# Patient Record
Sex: Female | Born: 1960 | Race: Black or African American | Hispanic: No | Marital: Single | Smoking: Current every day smoker
Health system: Southern US, Community
[De-identification: ages and names within clinical notes are randomized; demographics above are authoritative.]

## PROBLEM LIST (undated history)

## (undated) DIAGNOSIS — T7840XA Allergy, unspecified, initial encounter: Secondary | ICD-10-CM

## (undated) DIAGNOSIS — G4733 Obstructive sleep apnea (adult) (pediatric): Secondary | ICD-10-CM

## (undated) DIAGNOSIS — E079 Disorder of thyroid, unspecified: Secondary | ICD-10-CM

## (undated) DIAGNOSIS — H9190 Unspecified hearing loss, unspecified ear: Secondary | ICD-10-CM

## (undated) DIAGNOSIS — Z9989 Dependence on other enabling machines and devices: Secondary | ICD-10-CM

## (undated) HISTORY — DX: Allergy, unspecified, initial encounter: T78.40XA

## (undated) HISTORY — DX: Dependence on other enabling machines and devices: Z99.89

## (undated) HISTORY — DX: Disorder of thyroid, unspecified: E07.9

## (undated) HISTORY — DX: Obstructive sleep apnea (adult) (pediatric): G47.33

---

## 2003-05-09 ENCOUNTER — Encounter: Payer: Self-pay | Admitting: Emergency Medicine

## 2003-05-09 ENCOUNTER — Emergency Department (HOSPITAL_COMMUNITY): Admission: EM | Admit: 2003-05-09 | Discharge: 2003-05-10 | Payer: Self-pay | Admitting: Emergency Medicine

## 2004-02-16 ENCOUNTER — Emergency Department (HOSPITAL_COMMUNITY): Admission: EM | Admit: 2004-02-16 | Discharge: 2004-02-16 | Payer: Self-pay | Admitting: Emergency Medicine

## 2004-02-16 IMAGING — CR DG THORACIC SPINE 2V
3 series · 3 of 3 positions shown · non-contrast
Comparison: none

CLINICAL DATA: Metal container fell on shoulder and upper back.
 CERVICAL SPIINE FIVE VIEWS
 There is bony spurring at C5-6 and C6-7.  No fracture.
 IMPRESSION
 Degenerative changes of the lower cervical spine without fracture.  
 THORACIC SPINE
 AP and lateral views reveal diffuse bony spurring without fracture.
 No fracture.
 RIGHT SHOULDER TWO VIEWS
 No abnormality.

[view not recorded (1 of 3)]
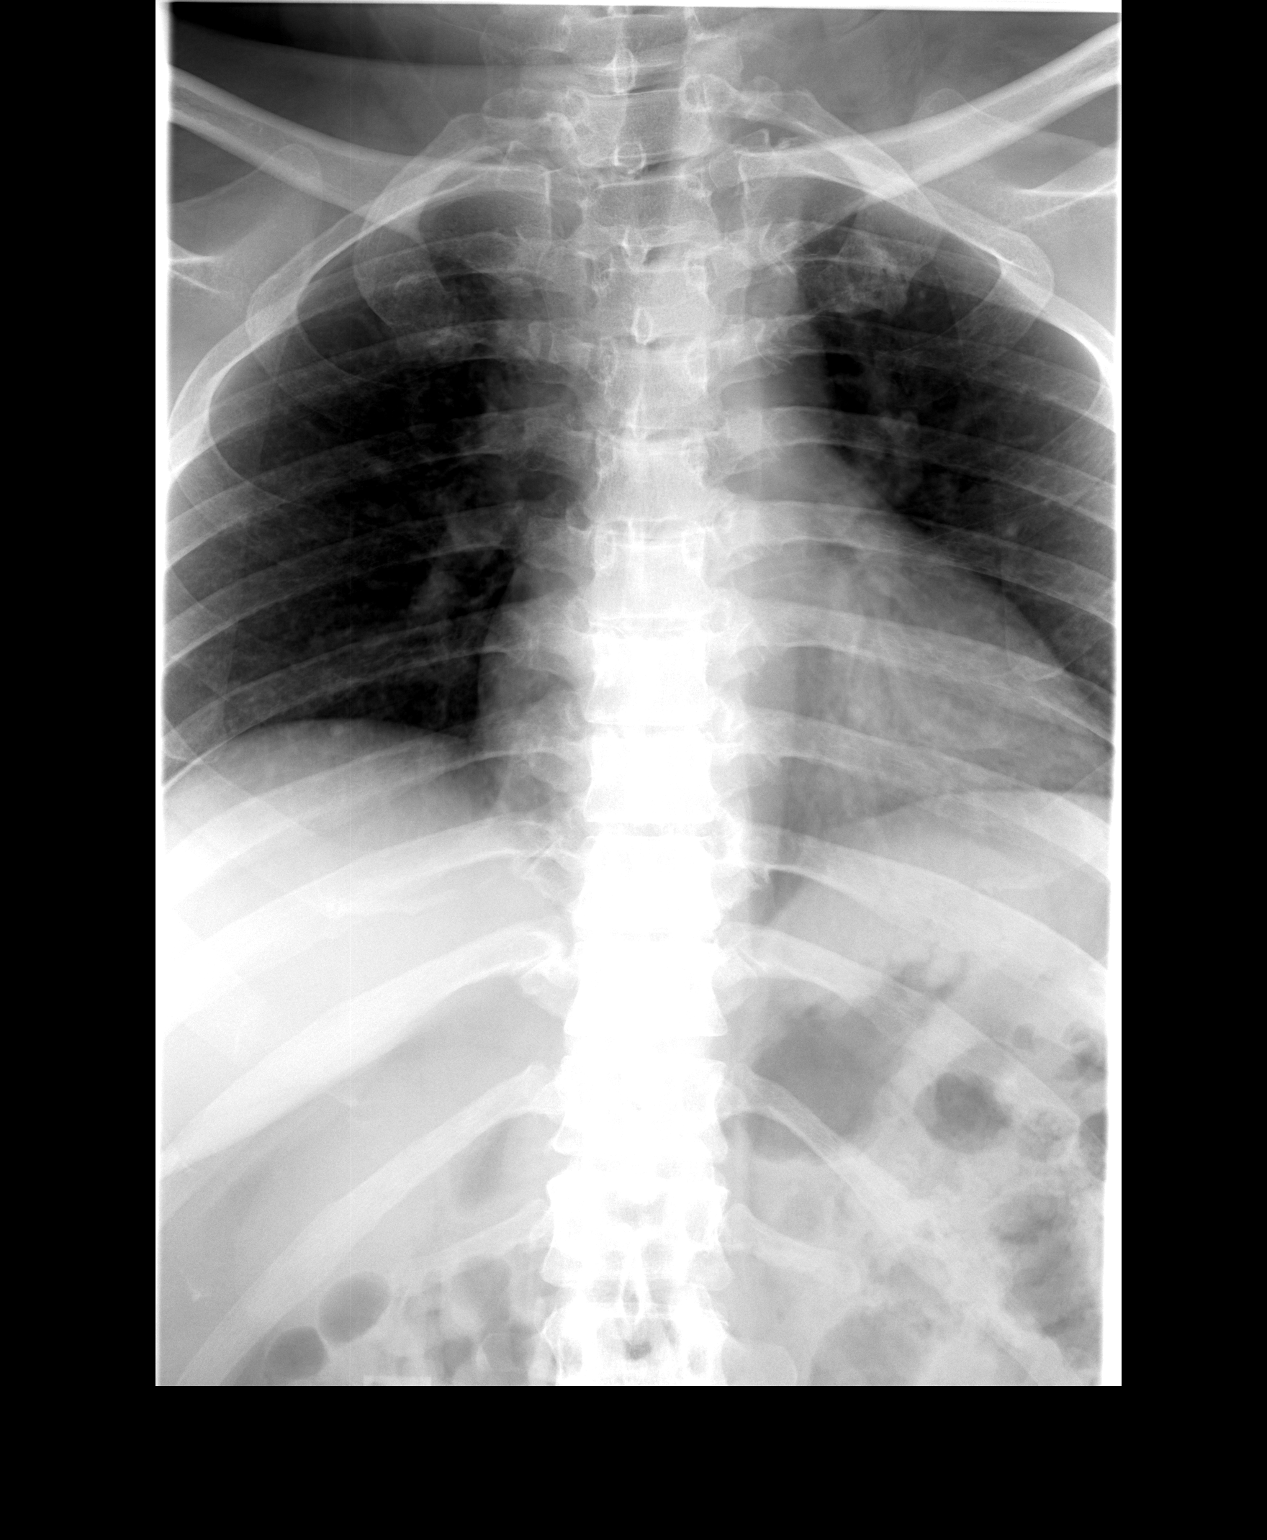

[view not recorded (2 of 3)]
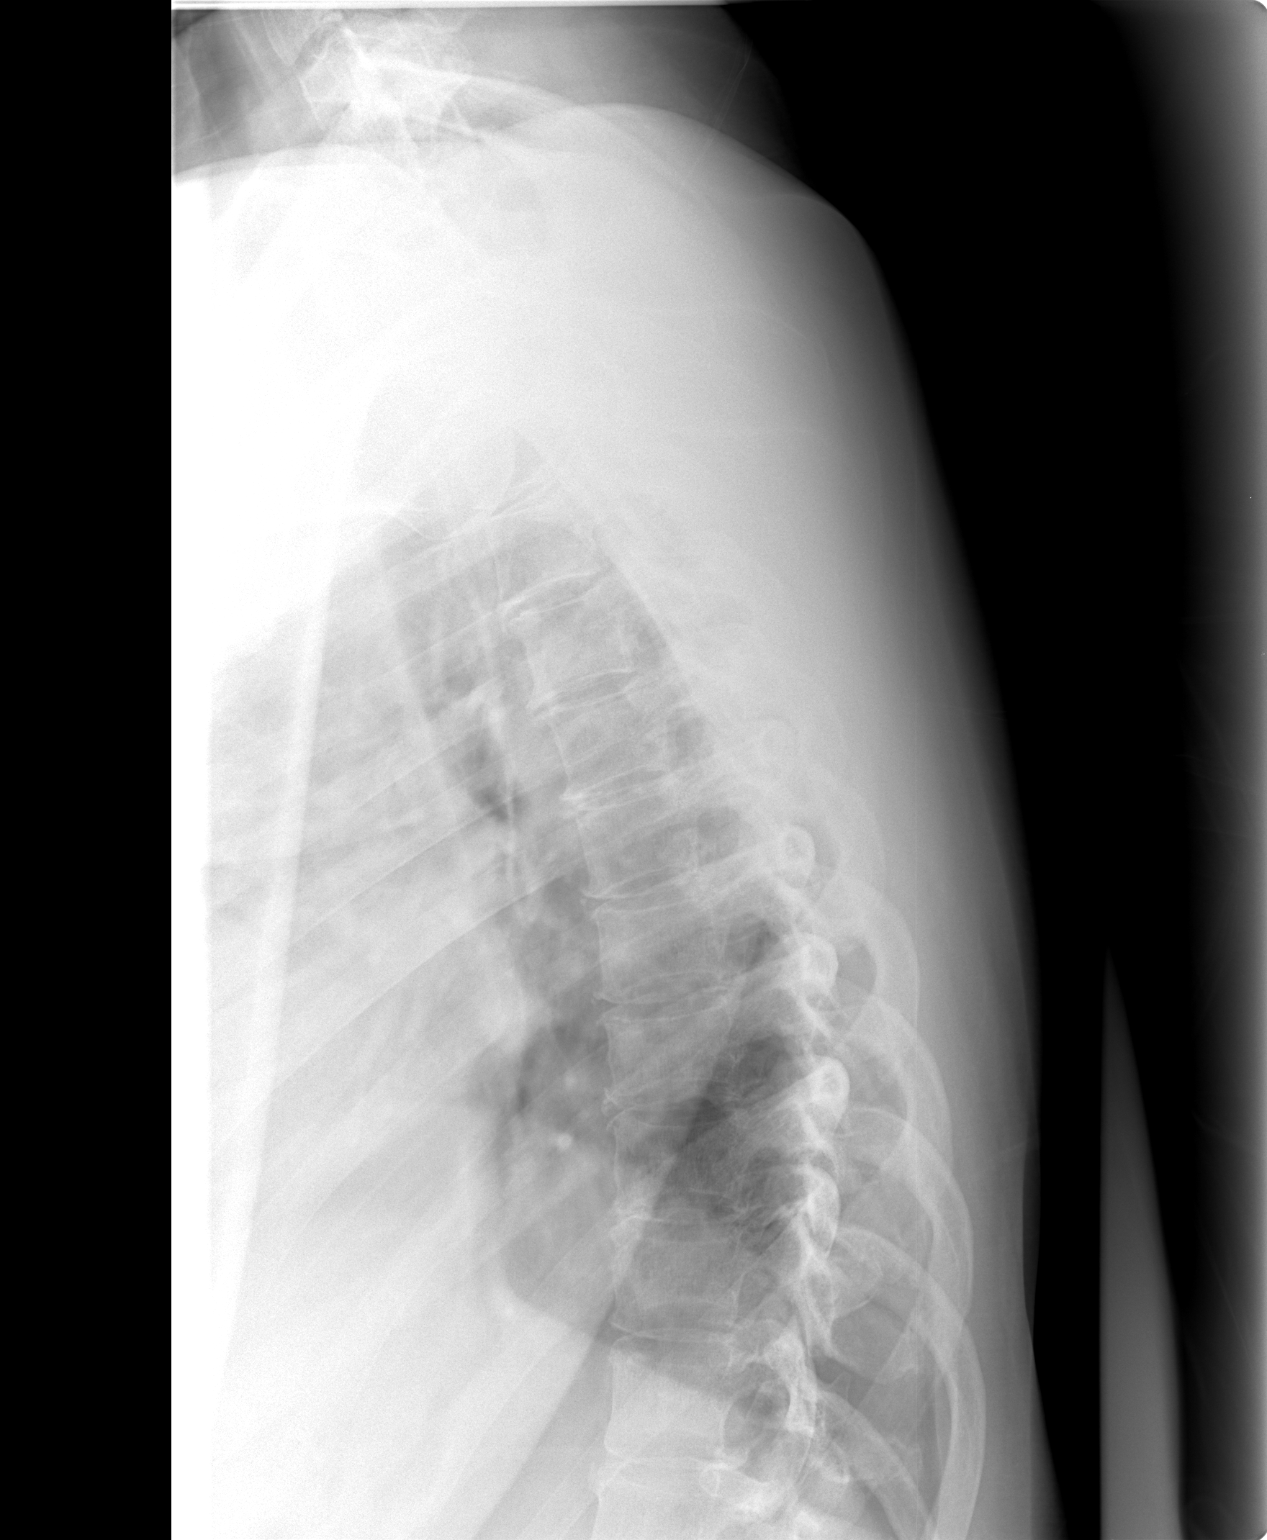

[view not recorded (3 of 3)]
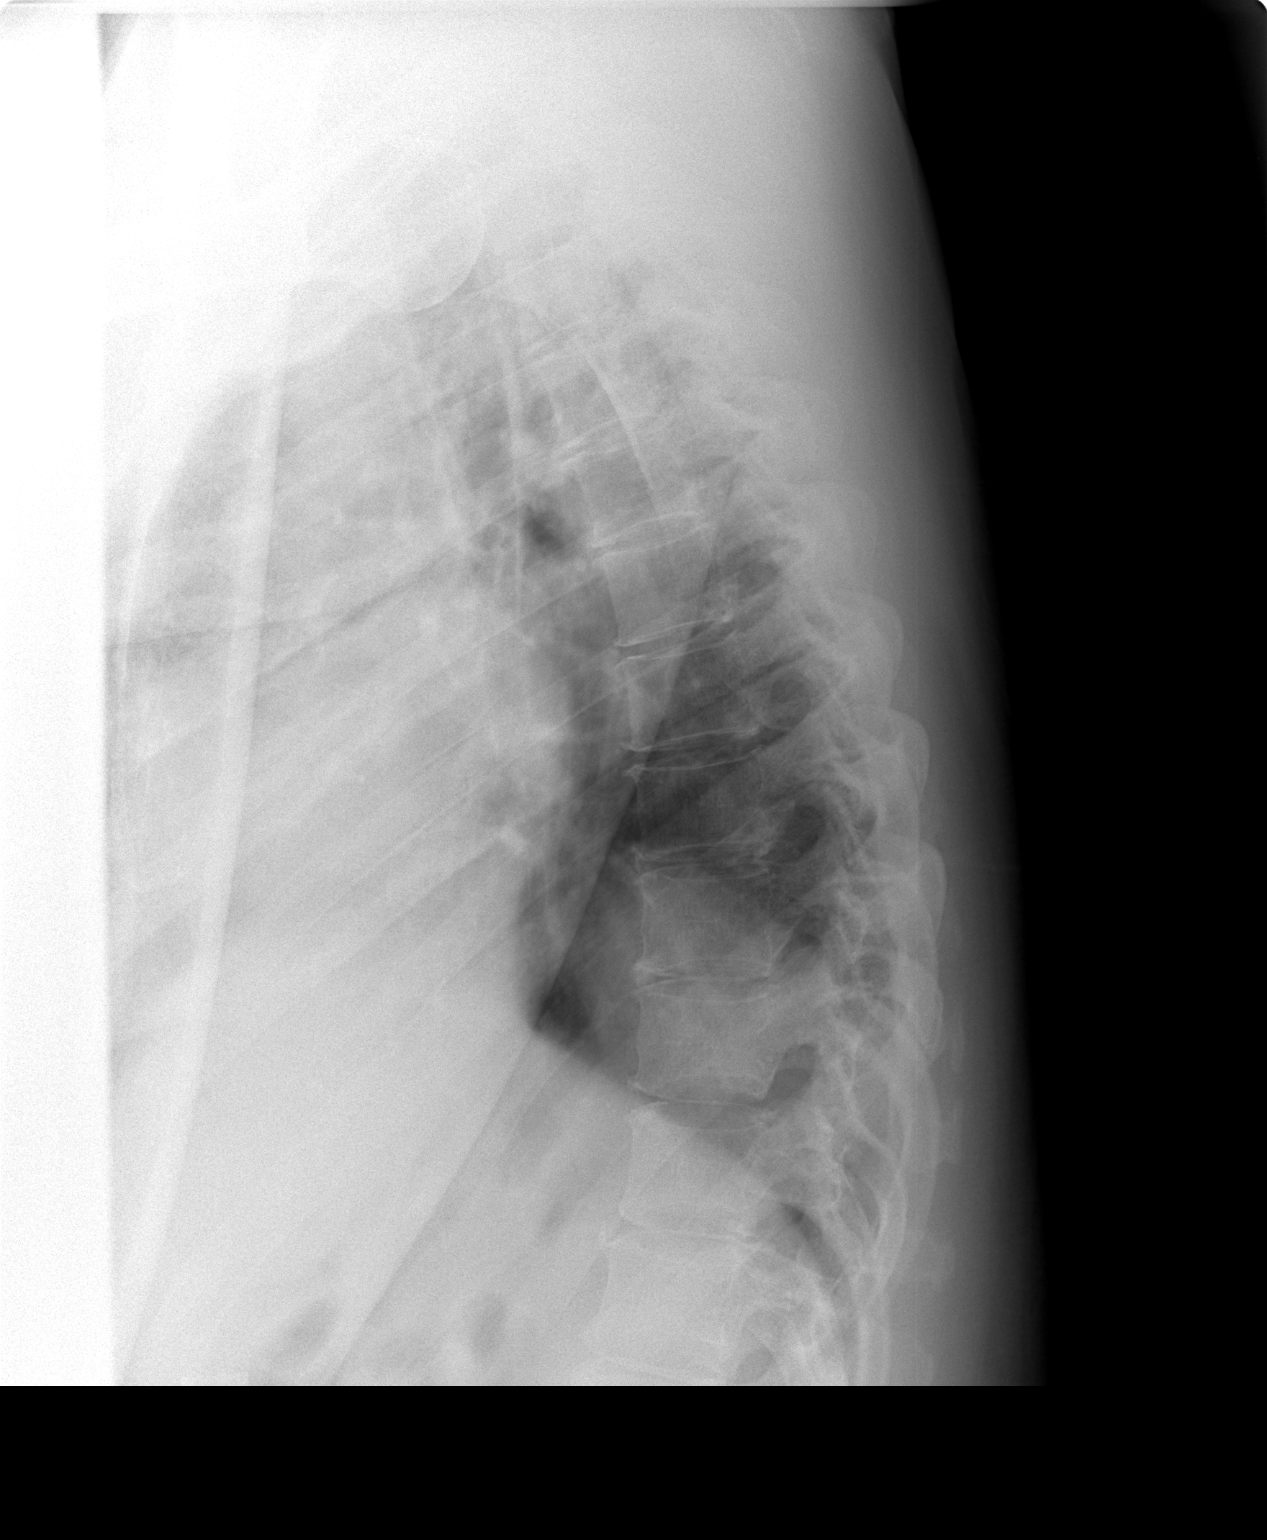

[3 of 3 positions shown; findings below may reference images not displayed]

## 2004-06-18 ENCOUNTER — Emergency Department (HOSPITAL_COMMUNITY): Admission: EM | Admit: 2004-06-18 | Discharge: 2004-06-18 | Payer: Self-pay | Admitting: Emergency Medicine

## 2010-04-20 ENCOUNTER — Emergency Department (HOSPITAL_COMMUNITY): Admission: EM | Admit: 2010-04-20 | Discharge: 2010-04-20 | Payer: Self-pay | Admitting: Emergency Medicine

## 2010-04-20 ENCOUNTER — Ambulatory Visit: Payer: Self-pay | Admitting: Vascular Surgery

## 2010-04-20 ENCOUNTER — Encounter (INDEPENDENT_AMBULATORY_CARE_PROVIDER_SITE_OTHER): Payer: Self-pay | Admitting: Emergency Medicine

## 2010-12-17 LAB — COMPREHENSIVE METABOLIC PANEL
AST: 16 U/L (ref 0–37)
CO2: 28 mEq/L (ref 19–32)
Calcium: 8.8 mg/dL (ref 8.4–10.5)
Chloride: 106 mEq/L (ref 96–112)
GFR calc Af Amer: >60 mL/min (ref >60)
GFR calc non Af Amer: >60 mL/min (ref >60)
Potassium: 4 mEq/L (ref 3.5–5.1)
Sodium: 138 mEq/L (ref 135–145)
Total Bilirubin: 0.3 mg/dL (ref 0.3–1.2)
Total Protein: 6 g/dL (ref 6.0–8.3)

## 2010-12-17 LAB — DIFFERENTIAL
Basophils Absolute: 0.1 10*3/uL (ref 0.0–0.1)
Eosinophils Relative: 1 % (ref 0–5)
Lymphocytes Relative: 53 % — ABNORMAL HIGH (ref 12–46)
Lymphs Abs: 4.3 10*3/uL — ABNORMAL HIGH (ref 0.7–4.0)
Monocytes Relative: 7 % (ref 3–12)
Neutro Abs: 3.2 10*3/uL (ref 1.7–7.7)
Neutrophils Relative %: 39 % — ABNORMAL LOW (ref 43–77)

## 2010-12-17 LAB — PROTIME-INR: Prothrombin Time: 12.6 seconds (ref 11.6–15.2)

## 2010-12-17 LAB — URINALYSIS, ROUTINE W REFLEX MICROSCOPIC
Bilirubin Urine: NEGATIVE
Leukocytes, UA: NEGATIVE
Nitrite: NEGATIVE
Urobilinogen, UA: 0.2 mg/dL (ref 0.0–1.0)

## 2010-12-17 LAB — POCT CARDIAC MARKERS
CKMB, poc: <1 ng/mL — ABNORMAL LOW (ref 1.0–8.0)
CKMB, poc: <1 ng/mL — ABNORMAL LOW (ref 1.0–8.0)
Myoglobin, poc: 67 ng/mL (ref 12–200)
Troponin i, poc: <0.05 ng/mL (ref 0.00–0.09)
Troponin i, poc: <0.05 ng/mL (ref 0.00–0.09)

## 2010-12-17 LAB — CBC
HCT: 38.7 % (ref 36.0–46.0)
Hemoglobin: 13.4 g/dL (ref 12.0–15.0)
MCH: 29.4 pg (ref 26.0–34.0)
MCHC: 34.6 g/dL (ref 30.0–36.0)
RDW: 12.9 % (ref 11.5–15.5)

## 2010-12-17 LAB — URINE MICROSCOPIC-ADD ON

## 2010-12-17 LAB — D-DIMER, QUANTITATIVE: D-Dimer, Quant: 0.33 ug/mL-FEU (ref 0.00–0.48)

## 2011-10-03 DIAGNOSIS — G4733 Obstructive sleep apnea (adult) (pediatric): Secondary | ICD-10-CM

## 2011-10-03 HISTORY — DX: Obstructive sleep apnea (adult) (pediatric): G47.33

## 2012-09-12 ENCOUNTER — Other Ambulatory Visit (INDEPENDENT_AMBULATORY_CARE_PROVIDER_SITE_OTHER): Payer: PRIVATE HEALTH INSURANCE

## 2012-09-12 ENCOUNTER — Ambulatory Visit (INDEPENDENT_AMBULATORY_CARE_PROVIDER_SITE_OTHER): Payer: PRIVATE HEALTH INSURANCE | Admitting: Internal Medicine

## 2012-09-12 ENCOUNTER — Encounter: Payer: Self-pay | Admitting: Internal Medicine

## 2012-09-12 ENCOUNTER — Other Ambulatory Visit (HOSPITAL_COMMUNITY)
Admission: RE | Admit: 2012-09-12 | Discharge: 2012-09-12 | Disposition: A | Discharge status: Home / Self Care | Payer: No Typology Code available for payment source | Source: Ambulatory Visit | Attending: Internal Medicine | Admitting: Internal Medicine

## 2012-09-12 ENCOUNTER — Other Ambulatory Visit: Payer: Self-pay | Admitting: Internal Medicine

## 2012-09-12 VITALS — BP 114/78 | HR 68 | Temp 97.7°F | Resp 16 | Wt 258.0 lb

## 2012-09-12 DIAGNOSIS — Z0000 Encounter for general adult medical examination without abnormal findings: Secondary | ICD-10-CM | POA: Insufficient documentation

## 2012-09-12 DIAGNOSIS — Z23 Encounter for immunization: Secondary | ICD-10-CM | POA: Insufficient documentation

## 2012-09-12 DIAGNOSIS — Z1231 Encounter for screening mammogram for malignant neoplasm of breast: Secondary | ICD-10-CM

## 2012-09-12 DIAGNOSIS — E049 Nontoxic goiter, unspecified: Secondary | ICD-10-CM

## 2012-09-12 DIAGNOSIS — E010 Iodine-deficiency related diffuse (endemic) goiter: Secondary | ICD-10-CM | POA: Insufficient documentation

## 2012-09-12 DIAGNOSIS — Z01419 Encounter for gynecological examination (general) (routine) without abnormal findings: Secondary | ICD-10-CM | POA: Insufficient documentation

## 2012-09-12 DIAGNOSIS — Z124 Encounter for screening for malignant neoplasm of cervix: Secondary | ICD-10-CM | POA: Insufficient documentation

## 2012-09-12 LAB — URINALYSIS, ROUTINE W REFLEX MICROSCOPIC
Specific Gravity, Urine: 1.01 (ref 1.000–1.030)
Total Protein, Urine: NEGATIVE
Urine Glucose: NEGATIVE

## 2012-09-12 LAB — COMPREHENSIVE METABOLIC PANEL
ALT: 12 U/L (ref 0–35)
Albumin: 3.9 g/dL (ref 3.5–5.2)
CO2: 26 mEq/L (ref 19–32)
Calcium: 9 mg/dL (ref 8.4–10.5)
Creatinine, Ser: 0.9 mg/dL (ref 0.4–1.2)
Glucose, Bld: 102 mg/dL — ABNORMAL HIGH (ref 70–99)
Potassium: 4.2 mEq/L (ref 3.5–5.1)
Sodium: 135 mEq/L (ref 135–145)
Total Bilirubin: 0.8 mg/dL (ref 0.3–1.2)

## 2012-09-12 LAB — CBC WITH DIFFERENTIAL/PLATELET
Eosinophils Relative: 0.5 % (ref 0.0–5.0)
HCT: 40.1 % (ref 36.0–46.0)
Hemoglobin: 13.8 g/dL (ref 12.0–15.0)
Lymphs Abs: 2.8 10*3/uL (ref 0.7–4.0)
MCHC: 34.5 g/dL (ref 30.0–36.0)
MCV: 85.4 fl (ref 78.0–100.0)
Neutrophils Relative %: 51.2 % (ref 43.0–77.0)
Platelets: 262 10*3/uL (ref 150.0–400.0)
RBC: 4.69 Mil/uL (ref 3.87–5.11)
RDW: 13.2 % (ref 11.5–14.6)
WBC: 7.1 10*3/uL (ref 4.5–10.5)

## 2012-09-12 LAB — LIPID PANEL
HDL: 42.4 mg/dL (ref >39.00)
Total CHOL/HDL Ratio: 5
VLDL: 22.6 mg/dL (ref 0.0–40.0)

## 2012-09-12 LAB — T3, FREE: T3, Free: 3.2 pg/mL (ref 2.3–4.2)

## 2012-09-12 LAB — HM PAP SMEAR: HM Pap smear: NORMAL

## 2012-09-12 LAB — TSH: TSH: 0.63 u[IU]/mL (ref 0.35–5.50)

## 2012-09-12 NOTE — Patient Instructions (Addendum)
Preventive Care for Adults, Female A healthy lifestyle and preventive care can promote health and wellness. Preventive health guidelines for women include the following key practices.  A routine yearly physical is a good way to check with your caregiver about your health and preventive screening. It is a chance to share any concerns and updates on your health, and to receive a thorough exam.  Visit your dentist for a routine exam and preventive care every 6 months. Brush your teeth twice a day and floss once a day. Good oral hygiene prevents tooth decay and gum disease.  The frequency of eye exams is based on your age, health, family medical history, use of contact lenses, and other factors. Follow your caregiver's recommendations for frequency of eye exams.  Eat a healthy diet. Foods like vegetables, fruits, whole grains, low-fat dairy products, and lean protein foods contain the nutrients you need without too many calories. Decrease your intake of foods high in solid fats, added sugars, and salt. Eat the right amount of calories for you.Get information about a proper diet from your caregiver, if necessary.  Regular physical exercise is one of the most important things you can do for your health. Most adults should get at least 150 minutes of moderate-intensity exercise (any activity that increases your heart rate and causes you to sweat) each week. In addition, most adults need muscle-strengthening exercises on 2 or more days a week.  Maintain a healthy weight. The body mass index (BMI) is a screening tool to identify possible weight problems. It provides an estimate of body fat based on height and weight. Your caregiver can help determine your BMI, and can help you achieve or maintain a healthy weight.For adults 20 years and older:  A BMI below 18.5 is considered underweight.  A BMI of 18.5 to 24.9 is normal.  A BMI of 25 to 29.9 is considered overweight.  A BMI of 30 and above is  considered obese.  Maintain normal blood lipids and cholesterol levels by exercising and minimizing your intake of saturated fat. Eat a balanced diet with plenty of fruit and vegetables. Blood tests for lipids and cholesterol should begin at age 20 and be repeated every 5 years. If your lipid or cholesterol levels are high, you are over 50, or you are at high risk for heart disease, you may need your cholesterol levels checked more frequently.Ongoing high lipid and cholesterol levels should be treated with medicines if diet and exercise are not effective.  If you smoke, find out from your caregiver how to quit. If you do not use tobacco, do not start.  If you are pregnant, do not drink alcohol. If you are breastfeeding, be very cautious about drinking alcohol. If you are not pregnant and choose to drink alcohol, do not exceed 1 drink per day. One drink is considered to be 12 ounces (355 mL) of beer, 5 ounces (148 mL) of wine, or 1.5 ounces (44 mL) of liquor.  Avoid use of street drugs. Do not share needles with anyone. Ask for help if you need support or instructions about stopping the use of drugs.  High blood pressure causes heart disease and increases the risk of stroke. Your blood pressure should be checked at least every 1 to 2 years. Ongoing high blood pressure should be treated with medicines if weight loss and exercise are not effective.  If you are 55 to 50 years old, ask your caregiver if you should take aspirin to prevent strokes.  Diabetes   screening involves taking a blood sample to check your fasting blood sugar level. This should be done once every 3 years, after age 45, if you are within normal weight and without risk factors for diabetes. Testing should be considered at a younger age or be carried out more frequently if you are overweight and have at least 1 risk factor for diabetes.  Breast cancer screening is essential preventive care for women. You should practice "breast  self-awareness." This means understanding the normal appearance and feel of your breasts and may include breast self-examination. Any changes detected, no matter how small, should be reported to a caregiver. Women in their 20s and 30s should have a clinical breast exam (CBE) by a caregiver as part of a regular health exam every 1 to 3 years. After age 40, women should have a CBE every year. Starting at age 40, women should consider having a mammography (breast X-ray test) every year. Women who have a family history of breast cancer should talk to their caregiver about genetic screening. Women at a high risk of breast cancer should talk to their caregivers about having magnetic resonance imaging (MRI) and a mammography every year.  The Pap test is a screening test for cervical cancer. A Pap test can show cell changes on the cervix that might become cervical cancer if left untreated. A Pap test is a procedure in which cells are obtained and examined from the lower end of the uterus (cervix).  Women should have a Pap test starting at age 21.  Between ages 21 and 29, Pap tests should be repeated every 2 years.  Beginning at age 30, you should have a Pap test every 3 years as long as the past 3 Pap tests have been normal.  Some women have medical problems that increase the chance of getting cervical cancer. Talk to your caregiver about these problems. It is especially important to talk to your caregiver if a new problem develops soon after your last Pap test. In these cases, your caregiver may recommend more frequent screening and Pap tests.  The above recommendations are the same for women who have or have not gotten the vaccine for human papillomavirus (HPV).  If you had a hysterectomy for a problem that was not cancer or a condition that could lead to cancer, then you no longer need Pap tests. Even if you no longer need a Pap test, a regular exam is a good idea to make sure no other problems are  starting.  If you are between ages 65 and 70, and you have had normal Pap tests going back 10 years, you no longer need Pap tests. Even if you no longer need a Pap test, a regular exam is a good idea to make sure no other problems are starting.  If you have had past treatment for cervical cancer or a condition that could lead to cancer, you need Pap tests and screening for cancer for at least 20 years after your treatment.  If Pap tests have been discontinued, risk factors (such as a new sexual partner) need to be reassessed to determine if screening should be resumed.  The HPV test is an additional test that may be used for cervical cancer screening. The HPV test looks for the virus that can cause the cell changes on the cervix. The cells collected during the Pap test can be tested for HPV. The HPV test could be used to screen women aged 30 years and older, and should   be used in women of any age who have unclear Pap test results. After the age of 30, women should have HPV testing at the same frequency as a Pap test.  Colorectal cancer can be detected and often prevented. Most routine colorectal cancer screening begins at the age of 50 and continues through age 75. However, your caregiver may recommend screening at an earlier age if you have risk factors for colon cancer. On a yearly basis, your caregiver may provide home test kits to check for hidden blood in the stool. Use of a small camera at the end of a tube, to directly examine the colon (sigmoidoscopy or colonoscopy), can detect the earliest forms of colorectal cancer. Talk to your caregiver about this at age 50, when routine screening begins. Direct examination of the colon should be repeated every 5 to 10 years through age 75, unless early forms of pre-cancerous polyps or small growths are found.  Hepatitis C blood testing is recommended for all people born from 1945 through 1965 and any individual with known risks for hepatitis C.  Practice  safe sex. Use condoms and avoid high-risk sexual practices to reduce the spread of sexually transmitted infections (STIs). STIs include gonorrhea, chlamydia, syphilis, trichomonas, herpes, HPV, and human immunodeficiency virus (HIV). Herpes, HIV, and HPV are viral illnesses that have no cure. They can result in disability, cancer, and death. Sexually active women aged 25 and younger should be checked for chlamydia. Older women with new or multiple partners should also be tested for chlamydia. Testing for other STIs is recommended if you are sexually active and at increased risk.  Osteoporosis is a disease in which the bones lose minerals and strength with aging. This can result in serious bone fractures. The risk of osteoporosis can be identified using a bone density scan. Women ages 65 and over and women at risk for fractures or osteoporosis should discuss screening with their caregivers. Ask your caregiver whether you should take a calcium supplement or vitamin D to reduce the rate of osteoporosis.  Menopause can be associated with physical symptoms and risks. Hormone replacement therapy is available to decrease symptoms and risks. You should talk to your caregiver about whether hormone replacement therapy is right for you.  Use sunscreen with sun protection factor (SPF) of 30 or more. Apply sunscreen liberally and repeatedly throughout the day. You should seek shade when your shadow is shorter than you. Protect yourself by wearing long sleeves, pants, a wide-brimmed hat, and sunglasses year round, whenever you are outdoors.  Once a month, do a whole body skin exam, using a mirror to look at the skin on your back. Notify your caregiver of new moles, moles that have irregular borders, moles that are larger than a pencil eraser, or moles that have changed in shape or color.  Stay current with required immunizations.  Influenza. You need a dose every fall (or winter). The composition of the flu vaccine  changes each year, so being vaccinated once is not enough.  Pneumococcal polysaccharide. You need 1 to 2 doses if you smoke cigarettes or if you have certain chronic medical conditions. You need 1 dose at age 65 (or older) if you have never been vaccinated.  Tetanus, diphtheria, pertussis (Tdap, Td). Get 1 dose of Tdap vaccine if you are younger than age 65, are over 65 and have contact with an infant, are a healthcare worker, are pregnant, or simply want to be protected from whooping cough. After that, you need a Td   booster dose every 10 years. Consult your caregiver if you have not had at least 3 tetanus and diphtheria-containing shots sometime in your life or have a deep or dirty wound.  HPV. You need this vaccine if you are a woman age 26 or younger. The vaccine is given in 3 doses over 6 months.  Measles, mumps, rubella (MMR). You need at least 1 dose of MMR if you were born in 1957 or later. You may also need a second dose.  Meningococcal. If you are age 19 to 21 and a first-year college student living in a residence hall, or have one of several medical conditions, you need to get vaccinated against meningococcal disease. You may also need additional booster doses.  Zoster (shingles). If you are age 60 or older, you should get this vaccine.  Varicella (chickenpox). If you have never had chickenpox or you were vaccinated but received only 1 dose, talk to your caregiver to find out if you need this vaccine.  Hepatitis A. You need this vaccine if you have a specific risk factor for hepatitis A virus infection or you simply wish to be protected from this disease. The vaccine is usually given as 2 doses, 6 to 18 months apart.  Hepatitis B. You need this vaccine if you have a specific risk factor for hepatitis B virus infection or you simply wish to be protected from this disease. The vaccine is given in 3 doses, usually over 6 months. Preventive Services / Frequency Ages 19 to 39  Blood  pressure check.** / Every 1 to 2 years.  Lipid and cholesterol check.** / Every 5 years beginning at age 20.  Clinical breast exam.** / Every 3 years for women in their 20s and 30s.  Pap test.** / Every 2 years from ages 21 through 29. Every 3 years starting at age 30 through age 65 or 70 with a history of 3 consecutive normal Pap tests.  HPV screening.** / Every 3 years from ages 30 through ages 65 to 70 with a history of 3 consecutive normal Pap tests.  Hepatitis C blood test.** / For any individual with known risks for hepatitis C.  Skin self-exam. / Monthly.  Influenza immunization.** / Every year.  Pneumococcal polysaccharide immunization.** / 1 to 2 doses if you smoke cigarettes or if you have certain chronic medical conditions.  Tetanus, diphtheria, pertussis (Tdap, Td) immunization. / A one-time dose of Tdap vaccine. After that, you need a Td booster dose every 10 years.  HPV immunization. / 3 doses over 6 months, if you are 26 and younger.  Measles, mumps, rubella (MMR) immunization. / You need at least 1 dose of MMR if you were born in 1957 or later. You may also need a second dose.  Meningococcal immunization. / 1 dose if you are age 19 to 21 and a first-year college student living in a residence hall, or have one of several medical conditions, you need to get vaccinated against meningococcal disease. You may also need additional booster doses.  Varicella immunization.** / Consult your caregiver.  Hepatitis A immunization.** / Consult your caregiver. 2 doses, 6 to 18 months apart.  Hepatitis B immunization.** / Consult your caregiver. 3 doses usually over 6 months. Ages 40 to 64  Blood pressure check.** / Every 1 to 2 years.  Lipid and cholesterol check.** / Every 5 years beginning at age 20.  Clinical breast exam.** / Every year after age 40.  Mammogram.** / Every year beginning at age 40   and continuing for as long as you are in good health. Consult with your  caregiver.  Pap test.** / Every 3 years starting at age 30 through age 65 or 70 with a history of 3 consecutive normal Pap tests.  HPV screening.** / Every 3 years from ages 30 through ages 65 to 70 with a history of 3 consecutive normal Pap tests.  Fecal occult blood test (FOBT) of stool. / Every year beginning at age 50 and continuing until age 75. You may not need to do this test if you get a colonoscopy every 10 years.  Flexible sigmoidoscopy or colonoscopy.** / Every 5 years for a flexible sigmoidoscopy or every 10 years for a colonoscopy beginning at age 50 and continuing until age 75.  Hepatitis C blood test.** / For all people born from 1945 through 1965 and any individual with known risks for hepatitis C.  Skin self-exam. / Monthly.  Influenza immunization.** / Every year.  Pneumococcal polysaccharide immunization.** / 1 to 2 doses if you smoke cigarettes or if you have certain chronic medical conditions.  Tetanus, diphtheria, pertussis (Tdap, Td) immunization.** / A one-time dose of Tdap vaccine. After that, you need a Td booster dose every 10 years.  Measles, mumps, rubella (MMR) immunization. / You need at least 1 dose of MMR if you were born in 1957 or later. You may also need a second dose.  Varicella immunization.** / Consult your caregiver.  Meningococcal immunization.** / Consult your caregiver.  Hepatitis A immunization.** / Consult your caregiver. 2 doses, 6 to 18 months apart.  Hepatitis B immunization.** / Consult your caregiver. 3 doses, usually over 6 months. Ages 65 and over  Blood pressure check.** / Every 1 to 2 years.  Lipid and cholesterol check.** / Every 5 years beginning at age 20.  Clinical breast exam.** / Every year after age 40.  Mammogram.** / Every year beginning at age 40 and continuing for as long as you are in good health. Consult with your caregiver.  Pap test.** / Every 3 years starting at age 30 through age 65 or 70 with a 3  consecutive normal Pap tests. Testing can be stopped between 65 and 70 with 3 consecutive normal Pap tests and no abnormal Pap or HPV tests in the past 10 years.  HPV screening.** / Every 3 years from ages 30 through ages 65 or 70 with a history of 3 consecutive normal Pap tests. Testing can be stopped between 65 and 70 with 3 consecutive normal Pap tests and no abnormal Pap or HPV tests in the past 10 years.  Fecal occult blood test (FOBT) of stool. / Every year beginning at age 50 and continuing until age 75. You may not need to do this test if you get a colonoscopy every 10 years.  Flexible sigmoidoscopy or colonoscopy.** / Every 5 years for a flexible sigmoidoscopy or every 10 years for a colonoscopy beginning at age 50 and continuing until age 75.  Hepatitis C blood test.** / For all people born from 1945 through 1965 and any individual with known risks for hepatitis C.  Osteoporosis screening.** / A one-time screening for women ages 65 and over and women at risk for fractures or osteoporosis.  Skin self-exam. / Monthly.  Influenza immunization.** / Every year.  Pneumococcal polysaccharide immunization.** / 1 dose at age 65 (or older) if you have never been vaccinated.  Tetanus, diphtheria, pertussis (Tdap, Td) immunization. / A one-time dose of Tdap vaccine if you are over   65 and have contact with an infant, are a healthcare worker, or simply want to be protected from whooping cough. After that, you need a Td booster dose every 10 years.  Varicella immunization.** / Consult your caregiver.  Meningococcal immunization.** / Consult your caregiver.  Hepatitis A immunization.** / Consult your caregiver. 2 doses, 6 to 18 months apart.  Hepatitis B immunization.** / Check with your caregiver. 3 doses, usually over 6 months. ** Family history and personal history of risk and conditions may change your caregiver's recommendations. Document Released: 11/14/2001 Document Revised: 12/11/2011  Document Reviewed: 02/13/2011 [employer] Patient Information 2013 [employer].  

## 2012-09-12 NOTE — Assessment & Plan Note (Signed)
Exam done PAP collected Vaccines were updated Labs ordered, Mammo ordered Pt ed material was given

## 2012-09-12 NOTE — Progress Notes (Signed)
Subjective:    Patient ID: Shelley [name], female    DOB: 1961/05/30, 51 y.o.   MRN: [id number]  Thyroid Problem Presents for follow-up visit. Symptoms include fatigue and weight gain. Patient reports no anxiety, cold intolerance, constipation, depressed mood, diaphoresis, diarrhea, dry skin, hair loss, heat intolerance, hoarse voice, leg swelling, menstrual problem, nail problem, palpitations, tremors, visual change or weight loss. The symptoms have been stable.      Review of Systems  Constitutional: Positive for weight gain and fatigue. Negative for fever, chills, weight loss, diaphoresis, activity change, appetite change and unexpected weight change.  HENT: Positive for neck pain. Negative for sore throat, hoarse voice, facial swelling, trouble swallowing, neck stiffness and voice change.   Eyes: Negative.   Respiratory: Positive for apnea. Negative for cough, choking, chest tightness, shortness of breath, wheezing and stridor.   Cardiovascular: Negative for chest pain, palpitations and leg swelling.  Gastrointestinal: Negative for nausea, vomiting, abdominal pain, diarrhea, constipation and blood in stool.  Genitourinary: Negative.  Negative for menstrual problem.  Musculoskeletal: Negative for myalgias, back pain, joint swelling, arthralgias and gait problem.  Skin: Negative.   Neurological: Negative.  Negative for tremors.  Hematological: Negative.  Negative for cold intolerance and heat intolerance.  Psychiatric/Behavioral: Negative.        Objective:   Physical Exam  Vitals reviewed. Constitutional: She is oriented to person, place, and time. She appears well-developed and well-nourished. No distress.  HENT:  Head: Normocephalic and atraumatic.  Mouth/Throat: Oropharynx is clear and moist. No oropharyngeal exudate.  Eyes: Conjunctivae normal are normal. Right eye exhibits no discharge. Left eye exhibits no discharge. No scleral icterus.  Neck: Normal range of motion. Neck  supple. No JVD present. No tracheal deviation present. Thyromegaly present.  Cardiovascular: Normal rate, regular rhythm, normal heart sounds and intact distal pulses.  Exam reveals no gallop and no friction rub.   No murmur heard. Pulmonary/Chest: Effort normal and breath sounds normal. No stridor. No respiratory distress. She has no wheezes. She has no rales.  Abdominal: Soft. Bowel sounds are normal. She exhibits no distension and no mass. There is no tenderness. There is no rebound and no guarding. Hernia confirmed negative in the right inguinal area and confirmed negative in the left inguinal area.  Genitourinary: Rectum normal, vagina normal and uterus normal. Rectal exam shows no external hemorrhoid, no internal hemorrhoid, no fissure, no mass, no tenderness and anal tone normal. Guaiac negative stool. No breast swelling, tenderness, discharge or bleeding. Pelvic exam was performed with patient supine. No labial fusion. There is no rash, tenderness, lesion or injury on the right labia. There is no rash, tenderness, lesion or injury on the left labia. Uterus is not deviated, not enlarged, not fixed and not tender. Cervix exhibits no motion tenderness, no discharge and no friability. Right adnexum displays no mass, no tenderness and no fullness. Left adnexum displays no mass, no tenderness and no fullness. No erythema, tenderness or bleeding around the vagina. No foreign body around the vagina. No signs of injury around the vagina. No vaginal discharge found.  Musculoskeletal: Normal range of motion. She exhibits no edema and no tenderness.  Lymphadenopathy:    She has no cervical adenopathy.       Right: No inguinal adenopathy present.       Left: No inguinal adenopathy present.  Neurological: She is oriented to person, place, and time.  Skin: Skin is warm and dry. No rash noted. She is not diaphoretic. No erythema. No pallor.  Psychiatric: She has a normal mood and affect. Her behavior is normal.  Judgment and thought content normal.      Lab Results  Component Value Date   WBC 8.2 04/20/2010   HGB 13.4 04/20/2010   HCT 38.7 04/20/2010   PLT 266 04/20/2010   GLUCOSE 94 04/20/2010   ALT 12 04/20/2010   AST 16 04/20/2010   NA 138 04/20/2010   K 4.0 04/20/2010   CL 106 04/20/2010   CREATININE 0.67 04/20/2010   BUN 6 04/20/2010   CO2 28 04/20/2010   INR 0.95 04/20/2010      Assessment & Plan:

## 2012-09-12 NOTE — Assessment & Plan Note (Signed)
I have asked her to get a Thryoid u/s done to look for goiter, masses, etc I will also check her TFT's

## 2012-09-13 LAB — T4: T4, Total: 8.4 ug/dL (ref 5.0–12.5)

## 2012-09-19 ENCOUNTER — Ambulatory Visit
Admission: RE | Admit: 2012-09-19 | Discharge: 2012-09-19 | Disposition: A | Discharge status: Home / Self Care | Payer: PRIVATE HEALTH INSURANCE | Source: Ambulatory Visit | Attending: Internal Medicine | Admitting: Internal Medicine

## 2012-09-19 ENCOUNTER — Encounter: Payer: Self-pay | Admitting: Internal Medicine

## 2012-09-19 DIAGNOSIS — E010 Iodine-deficiency related diffuse (endemic) goiter: Secondary | ICD-10-CM

## 2012-09-19 NOTE — Addendum Note (Signed)
Addended by: Etta Grandchild on: 09/19/2012 11:54 AM   Modules accepted: Orders

## 2012-10-04 ENCOUNTER — Ambulatory Visit (HOSPITAL_COMMUNITY)
Admission: RE | Admit: 2012-10-04 | Discharge: 2012-10-04 | Disposition: A | Discharge status: Home / Self Care | Payer: No Typology Code available for payment source | Source: Ambulatory Visit | Attending: Internal Medicine | Admitting: Internal Medicine

## 2012-10-04 DIAGNOSIS — Z1231 Encounter for screening mammogram for malignant neoplasm of breast: Secondary | ICD-10-CM | POA: Insufficient documentation

## 2012-10-15 ENCOUNTER — Ambulatory Visit: Payer: PRIVATE HEALTH INSURANCE | Admitting: Endocrinology

## 2012-10-24 ENCOUNTER — Ambulatory Visit (INDEPENDENT_AMBULATORY_CARE_PROVIDER_SITE_OTHER): Payer: PRIVATE HEALTH INSURANCE | Admitting: Endocrinology

## 2012-10-24 ENCOUNTER — Encounter: Payer: Self-pay | Admitting: Endocrinology

## 2012-10-24 VITALS — BP 126/70 | HR 62 | Wt 261.0 lb

## 2012-10-24 DIAGNOSIS — E049 Nontoxic goiter, unspecified: Secondary | ICD-10-CM

## 2012-10-24 DIAGNOSIS — E010 Iodine-deficiency related diffuse (endemic) goiter: Secondary | ICD-10-CM

## 2012-10-24 DIAGNOSIS — G4730 Sleep apnea, unspecified: Secondary | ICD-10-CM

## 2012-10-24 NOTE — Progress Notes (Signed)
  Subjective:    Patient ID: Shelley [name], female    DOB: [birth year], 52 y.o.   MRN: [id number]  HPI Pt says she was noted on routine exam to have a slight nodule at the neck, approx 5 years ago.  She has assoc pain.   Past Medical History  Diagnosis Date  . Thyroid disease   . Allergy   . OSA on CPAP 2013    No past surgical history on file.  History   Social History  . Marital Status: Married    Spouse Name: N/A    Number of Children: N/A  . Years of Education: N/A   Occupational History  . Not on file.   Social History Main Topics  . Smoking status: Current Every Day Smoker  . Smokeless tobacco: Never Used  . Alcohol Use: No  . Drug Use: No  . Sexually Active: No   Other Topics Concern  . Not on file   Social History Narrative  . No narrative on file    No current outpatient prescriptions on file prior to visit.    Allergies  Allergen Reactions  . Penicillins     Caused deafness per pt    Family History  Problem Relation Age of Onset  . Arthritis Mother   . Diabetes Mother   . Heart disease Father   . Hyperlipidemia Father   . Hypertension Father   . Stroke Father   . Alcohol abuse Neg Hx   . Cancer Neg Hx   . Early death Neg Hx   . Kidney disease Neg Hx    BP 126/70  Pulse 62  Wt 261 lb (118.389 kg)  SpO2 98%  Review of Systems denies weight loss, hoarseness, double vision, sob, diarrhea, polyuria, excessive diaphoresis, numbness, tremor, anxiety, menopausal sxs, and easy bruising.  She has intermittent headache, myalgias, cold intolerance, rhinorrhea, easy bruising, and palpitations.      Objective:   Physical Exam VS: see vs page GEN: no distress HEAD: head: no deformity eyes: no periorbital swelling, no proptosis external nose and ears are normal mouth: no lesion seen NECK: supple, thyroid is not enlarged CHEST WALL: no deformity LUNGS:  Clear to auscultation CV: reg rate and rhythm, no murmur ABD: abdomen is soft, nontender.  no  hepatosplenomegaly.  not distended.  no hernia MUSCULOSKELETAL: muscle bulk and strength are grossly normal.  no obvious joint swelling.  gait is normal and steady. EXTEMITIES: no deformity.  no ulcer on the feet.  feet are of normal color and temp.  no edema PULSES: dorsalis pedis intact bilat.  no carotid bruit NEURO:  cn 2-12 grossly intact.   readily moves all 4's.  sensation is intact to touch on the feet SKIN:  Normal texture and temperature.  No rash or suspicious lesion is visible.   NODES:  None palpable at the neck. PSYCH: alert, oriented x3.  Does not appear anxious nor depressed. Lab Results  Component Value Date   TSH 0.63 09/12/2012   T4TOTAL 8.4 09/12/2012  (i reviewed [place] result)    Assessment & Plan:  Small thyroid nodule, new, too small to need bx now Palpitations, not thyroid-related Neck pain.  Not thyroid-related

## 2012-10-24 NOTE — Patient Instructions (Addendum)
No further testing or treatment is needed for your thyroid now, as the nodule is really small, and your blood tests are normal. Because your blood tests are normal, you should conclude that your symptoms are not coming from the thyroid. Please return in 1 year.

## 2013-01-02 ENCOUNTER — Encounter: Payer: Self-pay | Admitting: Neurology

## 2013-05-19 ENCOUNTER — Ambulatory Visit (INDEPENDENT_AMBULATORY_CARE_PROVIDER_SITE_OTHER): Payer: No Typology Code available for payment source | Admitting: Family Medicine

## 2013-05-19 VITALS — BP 118/80 | HR 58 | Temp 98.3°F | Resp 16 | Ht 65.0 in | Wt 261.0 lb

## 2013-05-19 DIAGNOSIS — L0291 Cutaneous abscess, unspecified: Secondary | ICD-10-CM

## 2013-05-19 DIAGNOSIS — L0390 Cellulitis, unspecified: Secondary | ICD-10-CM

## 2013-05-19 MED ORDER — DOXYCYCLINE HYCLATE 100 MG PO TABS: 100.0000 mg | ORAL_TABLET | Freq: Two times a day (BID) | ORAL | Status: DC

## 2013-05-19 NOTE — Progress Notes (Signed)
52 yo deaf woman with right lower leg redness x 2 days and itchiness.  Objective:  NAD Right lower extremity shows 3 cm irregular area of erythema with a red streak migrating medially.  Assessment: Probable spider bite with cellulitis  Plan: Doxycycline 100 mg twice a day x7 days  Signed, and Elvina Sidle

## 2013-08-05 ENCOUNTER — Encounter: Payer: Self-pay | Admitting: Neurology

## 2014-02-26 ENCOUNTER — Other Ambulatory Visit: Payer: Self-pay | Admitting: Internal Medicine

## 2014-02-26 DIAGNOSIS — Z1231 Encounter for screening mammogram for malignant neoplasm of breast: Secondary | ICD-10-CM

## 2014-03-06 ENCOUNTER — Ambulatory Visit (HOSPITAL_COMMUNITY)
Admission: RE | Admit: 2014-03-06 | Discharge: 2014-03-06 | Disposition: A | Discharge status: Home / Self Care | Payer: No Typology Code available for payment source | Source: Ambulatory Visit | Attending: Internal Medicine | Admitting: Internal Medicine

## 2014-03-06 DIAGNOSIS — Z1231 Encounter for screening mammogram for malignant neoplasm of breast: Secondary | ICD-10-CM | POA: Insufficient documentation

## 2014-03-06 DIAGNOSIS — Z803 Family history of malignant neoplasm of breast: Secondary | ICD-10-CM | POA: Insufficient documentation

## 2014-03-08 LAB — HM MAMMOGRAPHY: HM MAMMO: NORMAL

## 2017-07-12 ENCOUNTER — Other Ambulatory Visit: Payer: Self-pay | Admitting: Family Medicine

## 2017-07-12 DIAGNOSIS — Z1231 Encounter for screening mammogram for malignant neoplasm of breast: Secondary | ICD-10-CM

## 2017-07-17 ENCOUNTER — Ambulatory Visit: Payer: No Typology Code available for payment source

## 2017-08-01 ENCOUNTER — Ambulatory Visit
Admission: RE | Admit: 2017-08-01 | Discharge: 2017-08-01 | Disposition: A | Discharge status: Home / Self Care | Payer: Self-pay | Source: Ambulatory Visit | Attending: Family Medicine | Admitting: Family Medicine

## 2017-08-01 DIAGNOSIS — Z1231 Encounter for screening mammogram for malignant neoplasm of breast: Secondary | ICD-10-CM

## 2018-11-06 ENCOUNTER — Other Ambulatory Visit: Payer: Self-pay | Admitting: Family Medicine

## 2018-11-06 DIAGNOSIS — Z1231 Encounter for screening mammogram for malignant neoplasm of breast: Secondary | ICD-10-CM

## 2018-12-03 ENCOUNTER — Ambulatory Visit
Admission: RE | Admit: 2018-12-03 | Discharge: 2018-12-03 | Disposition: A | Discharge status: Home / Self Care | Payer: 59 | Source: Ambulatory Visit | Attending: Family Medicine | Admitting: Family Medicine

## 2018-12-03 DIAGNOSIS — Z1231 Encounter for screening mammogram for malignant neoplasm of breast: Secondary | ICD-10-CM

## 2020-02-09 ENCOUNTER — Other Ambulatory Visit: Payer: Self-pay | Admitting: Family Medicine

## 2020-02-09 DIAGNOSIS — Z1231 Encounter for screening mammogram for malignant neoplasm of breast: Secondary | ICD-10-CM

## 2020-02-16 ENCOUNTER — Ambulatory Visit
Admission: RE | Admit: 2020-02-16 | Discharge: 2020-02-16 | Disposition: A | Discharge status: Home / Self Care | Payer: 59 | Source: Ambulatory Visit | Attending: Family Medicine | Admitting: Family Medicine

## 2020-02-16 ENCOUNTER — Other Ambulatory Visit: Payer: Self-pay

## 2020-02-16 DIAGNOSIS — Z1231 Encounter for screening mammogram for malignant neoplasm of breast: Secondary | ICD-10-CM

## 2021-05-11 ENCOUNTER — Ambulatory Visit (HOSPITAL_COMMUNITY)
Admission: EM | Admit: 2021-05-11 | Discharge: 2021-05-11 | Disposition: A | Discharge status: Home / Self Care | Payer: Self-pay | Attending: Emergency Medicine | Admitting: Emergency Medicine

## 2021-05-11 ENCOUNTER — Other Ambulatory Visit: Payer: Self-pay

## 2021-05-11 ENCOUNTER — Encounter (HOSPITAL_COMMUNITY): Payer: Self-pay

## 2021-05-11 DIAGNOSIS — H00014 Hordeolum externum left upper eyelid: Secondary | ICD-10-CM

## 2021-05-11 MED ORDER — ERYTHROMYCIN 5 MG/GM OP OINT: TOPICAL_OINTMENT | OPHTHALMIC | 0 refills | Status: DC

## 2021-05-11 MED ORDER — SULFAMETHOXAZOLE-TRIMETHOPRIM 200-40 MG/5ML PO SUSP: 10.0000 mL | Freq: Two times a day (BID) | ORAL | 0 refills | Status: AC

## 2021-05-11 NOTE — ED Provider Notes (Signed)
[facility]    CSN: [id number] Arrival date & time: 05/11/21  1339      History   Chief Complaint Chief Complaint  Patient presents with   Eye Problem    HPI Shelley [name] is a 60 y.o. female.   Poke to pt with nurse and interp line for hearing impaired. Pt has lt eye pain and swelling for a few days. Stats that she was rubbing her eye and noticed swelling and pain. Denies any visual changes. No fevers, no runny nose or congestion. Has use warm compress with no change.    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 No pertinent surgical history.  OB History   No obstetric history on file.      Home Medications    Prior to Admission medications   Medication Sig Start Date End Date Taking? Authorizing Provider  erythromycin ophthalmic ointment Place a 1/2 inch ribbon of ointment into the lower eyelid. 05/11/21  Yes Coralyn Mark, NP  sulfamethoxazole-trimethoprim (BACTRIM) 200-40 MG/5ML suspension Take 10 mLs by mouth 2 (two) times daily for 5 days. 05/11/21 05/16/21 Yes Coralyn Mark, NP    Family History Family History  Problem Relation Age of Onset   Arthritis Mother    Diabetes Mother    Heart disease Father    Hyperlipidemia Father    Hypertension Father    Stroke Father    Alcohol abuse Neg Hx    Cancer Neg Hx    Early death Neg Hx    Kidney disease Neg Hx    Breast cancer Neg Hx     Social History Social History   Tobacco Use   Smoking status: Every Day    Types: Cigarettes   Smokeless tobacco: Never  Substance Use Topics   Alcohol use: No   Drug use: No      Allergies   Penicillins and Tylenol [acetaminophen]   Review of Systems Review of Systems  Constitutional: Negative.   HENT: Negative.         Hearing impaired   Eyes:  Positive for pain, discharge, redness and itching. Negative for photophobia and visual disturbance.  Respiratory: Negative.    Cardiovascular: Negative.   Genitourinary: Negative.   Neurological: Negative.     Physical Exam Triage Vital Signs ED Triage Vitals [05/11/21 1525]  Enc Vitals Group     BP (!) 141/100     Pulse Rate 79     Resp 18     Temp 98.4 F (36.9 C)     Temp Source Oral     SpO2 100 %     Weight      Height      Head Circumference      Peak Flow      Pain Score      Pain Loc      Pain Edu?      Excl. in GC?    No data found.  Updated Vital Signs BP (!) 141/100 (BP Location: Right Arm)   Pulse 79   Temp 98.4 F (36.9  C) (Oral)   Resp 18   SpO2 100%   Visual Acuity Right Eye Distance:   Left Eye Distance:   Bilateral Distance:    Right Eye Near:   Left Eye Near:    Bilateral Near:     Physical Exam Constitutional:      Appearance: Normal appearance.  Eyes:     General:        Right eye: Hordeolum present.     Conjunctiva/sclera:     Left eye: Exudate present.     Comments: Lt upper inner lid erythema , edema to lid   Neurological:     Mental Status: She is alert.     UC Treatments / Results  Labs (all labs ordered are listed, but only abnormal results are displayed) Labs Reviewed - No data to display  EKG   Radiology No results found.  Procedures Procedures (including critical care time)  Medications Ordered in UC Medications - No data to display  Initial Impression / Assessment and Plan / UC Course  I have reviewed the triage vital signs and the nursing notes.  Pertinent labs & imaging results that were available during my care of the patient were reviewed by me and considered in my medical decision making (see chart for details).      Use warm compress Take motrin as needed for pain  Avoid touching eyes Go to the er if you begin to have redness that extends to eye area or visual changes  Pt states that she has an eye appointment soon and will follow up with them  Final Clinical Impressions(s) / UC Diagnoses   Final diagnoses:  Hordeolum externum of left upper eyelid     Discharge Instructions      Use warm compress Take motrin as needed for pain  Avoid touching eyes Go to the er if you begin to have redness that extends to eye area or visual changes      ED Prescriptions     Medication Sig Dispense Auth. Provider   erythromycin ophthalmic ointment Place a 1/2 inch ribbon of ointment into the lower eyelid. 3.5 g Maple Mirza L, NP   sulfamethoxazole-trimethoprim (BACTRIM) 200-40 MG/5ML suspension Take 10 mLs by mouth 2 (two) times daily for 5 days. 100 mL Coralyn Mark, NP      PDMP not reviewed this encounter.   Coralyn Mark, NP 05/11/21 [phone]

## 2021-05-11 NOTE — Discharge Instructions (Addendum)
Use warm compress Take motrin as needed for pain  Avoid touching eyes Go to the er if you begin to have redness that extends to eye area or visual changes

## 2021-05-11 NOTE — ED Triage Notes (Signed)
Pt presents with swelling left eye x 3-4 days. States she rubbed her eye as she though she had a eyelash inside the left eye. .Warm compress and Vaseline gives no relief. Denies fever, chest pain.

## 2022-01-06 ENCOUNTER — Inpatient Hospital Stay (HOSPITAL_BASED_OUTPATIENT_CLINIC_OR_DEPARTMENT_OTHER)
Admission: EM | Admit: 2022-01-06 | Discharge: 2022-01-09 | DRG: 439 | Disposition: A | Discharge status: Home / Self Care | Payer: Self-pay | Attending: Internal Medicine | Admitting: Internal Medicine

## 2022-01-06 ENCOUNTER — Other Ambulatory Visit: Payer: Self-pay

## 2022-01-06 ENCOUNTER — Emergency Department (HOSPITAL_BASED_OUTPATIENT_CLINIC_OR_DEPARTMENT_OTHER): Payer: Self-pay

## 2022-01-06 ENCOUNTER — Encounter (HOSPITAL_BASED_OUTPATIENT_CLINIC_OR_DEPARTMENT_OTHER): Payer: Self-pay

## 2022-01-06 DIAGNOSIS — E440 Moderate protein-calorie malnutrition: Secondary | ICD-10-CM | POA: Insufficient documentation

## 2022-01-06 DIAGNOSIS — K852 Alcohol induced acute pancreatitis without necrosis or infection: Principal | ICD-10-CM | POA: Diagnosis present

## 2022-01-06 DIAGNOSIS — E538 Deficiency of other specified B group vitamins: Secondary | ICD-10-CM

## 2022-01-06 DIAGNOSIS — Z83438 Family history of other disorder of lipoprotein metabolism and other lipidemia: Secondary | ICD-10-CM

## 2022-01-06 DIAGNOSIS — K920 Hematemesis: Secondary | ICD-10-CM | POA: Diagnosis present

## 2022-01-06 DIAGNOSIS — R1110 Vomiting, unspecified: Secondary | ICD-10-CM | POA: Diagnosis present

## 2022-01-06 DIAGNOSIS — R9431 Abnormal electrocardiogram [ECG] [EKG]: Secondary | ICD-10-CM | POA: Diagnosis present

## 2022-01-06 DIAGNOSIS — E162 Hypoglycemia, unspecified: Secondary | ICD-10-CM

## 2022-01-06 DIAGNOSIS — H9190 Unspecified hearing loss, unspecified ear: Secondary | ICD-10-CM

## 2022-01-06 DIAGNOSIS — E010 Iodine-deficiency related diffuse (endemic) goiter: Secondary | ICD-10-CM | POA: Diagnosis present

## 2022-01-06 DIAGNOSIS — R634 Abnormal weight loss: Secondary | ICD-10-CM | POA: Diagnosis present

## 2022-01-06 DIAGNOSIS — Z20822 Contact with and (suspected) exposure to covid-19: Secondary | ICD-10-CM | POA: Diagnosis present

## 2022-01-06 DIAGNOSIS — F1010 Alcohol abuse, uncomplicated: Secondary | ICD-10-CM

## 2022-01-06 DIAGNOSIS — R310 Gross hematuria: Secondary | ICD-10-CM

## 2022-01-06 DIAGNOSIS — K859 Acute pancreatitis without necrosis or infection, unspecified: Secondary | ICD-10-CM | POA: Diagnosis present

## 2022-01-06 DIAGNOSIS — F17210 Nicotine dependence, cigarettes, uncomplicated: Secondary | ICD-10-CM | POA: Diagnosis present

## 2022-01-06 DIAGNOSIS — Z833 Family history of diabetes mellitus: Secondary | ICD-10-CM

## 2022-01-06 DIAGNOSIS — K760 Fatty (change of) liver, not elsewhere classified: Secondary | ICD-10-CM | POA: Diagnosis present

## 2022-01-06 DIAGNOSIS — G4733 Obstructive sleep apnea (adult) (pediatric): Secondary | ICD-10-CM | POA: Diagnosis present

## 2022-01-06 DIAGNOSIS — Z823 Family history of stroke: Secondary | ICD-10-CM

## 2022-01-06 DIAGNOSIS — E876 Hypokalemia: Secondary | ICD-10-CM | POA: Diagnosis present

## 2022-01-06 DIAGNOSIS — Z8249 Family history of ischemic heart disease and other diseases of the circulatory system: Secondary | ICD-10-CM

## 2022-01-06 DIAGNOSIS — G4730 Sleep apnea, unspecified: Secondary | ICD-10-CM | POA: Diagnosis present

## 2022-01-06 DIAGNOSIS — E079 Disorder of thyroid, unspecified: Secondary | ICD-10-CM | POA: Diagnosis present

## 2022-01-06 HISTORY — DX: Unspecified hearing loss, unspecified ear: H91.90

## 2022-01-06 LAB — COMPREHENSIVE METABOLIC PANEL
ALT: 59 U/L — ABNORMAL HIGH (ref 0–44)
AST: 68 U/L — ABNORMAL HIGH (ref 15–41)
Albumin: 4.2 g/dL (ref 3.5–5.0)
Alkaline Phosphatase: 78 U/L (ref 38–126)
Anion gap: 20 — ABNORMAL HIGH (ref 5–15)
BUN: 7 mg/dL (ref 6–20)
CO2: 29 mmol/L (ref 22–32)
Calcium: 9.8 mg/dL (ref 8.9–10.3)
Chloride: 88 mmol/L — ABNORMAL LOW (ref 98–111)
Creatinine, Ser: 0.68 mg/dL (ref 0.44–1.00)
GFR, Estimated: >60 mL/min (ref >60)
Glucose, Bld: 94 mg/dL (ref 70–99)
Potassium: 2.7 mmol/L — CL (ref 3.5–5.1)
Sodium: 137 mmol/L (ref 135–145)
Total Bilirubin: 1.1 mg/dL (ref 0.3–1.2)
Total Protein: 7 g/dL (ref 6.5–8.1)

## 2022-01-06 LAB — URINALYSIS, ROUTINE W REFLEX MICROSCOPIC
Glucose, UA: NEGATIVE mg/dL
Ketones, ur: >80 mg/dL — AB
Leukocytes,Ua: NEGATIVE
Nitrite: NEGATIVE
Protein, ur: >300 mg/dL — AB
Specific Gravity, Urine: 1.03 (ref 1.005–1.030)
pH: 6 (ref 5.0–8.0)

## 2022-01-06 LAB — CBC
HCT: 45.7 % (ref 36.0–46.0)
Hemoglobin: 16.9 g/dL — ABNORMAL HIGH (ref 12.0–15.0)
MCH: 40.2 pg — ABNORMAL HIGH (ref 26.0–34.0)
MCHC: 37 g/dL — ABNORMAL HIGH (ref 30.0–36.0)
MCV: 108.8 fL — ABNORMAL HIGH (ref 80.0–100.0)
Platelets: 209 10*3/uL (ref 150–400)
RBC: 4.2 MIL/uL (ref 3.87–5.11)
RDW: 14.3 % (ref 11.5–15.5)
WBC: 6 10*3/uL (ref 4.0–10.5)
nRBC: 0 % (ref 0.0–0.2)

## 2022-01-06 LAB — PREGNANCY, URINE: Preg Test, Ur: NEGATIVE

## 2022-01-06 LAB — LIPASE, BLOOD: Lipase: 447 U/L — ABNORMAL HIGH (ref 11–51)

## 2022-01-06 LAB — ETHANOL: Alcohol, Ethyl (B): <10 mg/dL (ref <10)

## 2022-01-06 LAB — MAGNESIUM: Magnesium: 2 mg/dL (ref 1.7–2.4)

## 2022-01-06 MED ORDER — POTASSIUM CHLORIDE CRYS ER 20 MEQ PO TBCR
40.0000 meq | EXTENDED_RELEASE_TABLET | Freq: Once | ORAL | Status: AC
  Administered 2022-01-06: 40 meq via ORAL
  Filled 2022-01-06: qty 2

## 2022-01-06 MED ORDER — ONDANSETRON HCL 4 MG/2ML IJ SOLN
4.0000 mg | Freq: Once | INTRAMUSCULAR | Status: AC
  Administered 2022-01-06: 4 mg via INTRAVENOUS
  Filled 2022-01-06: qty 2

## 2022-01-06 MED ORDER — LORAZEPAM 2 MG/ML IJ SOLN
0.0000 mg | Freq: Four times a day (QID) | INTRAMUSCULAR | Status: AC
  Administered 2022-01-07: 2 mg via INTRAVENOUS
  Filled 2022-01-06: qty 1

## 2022-01-06 MED ORDER — THIAMINE HCL 100 MG/ML IJ SOLN
100.0000 mg | Freq: Every day | INTRAMUSCULAR | Status: DC
  Administered 2022-01-07 – 2022-01-08 (×2): 100 mg via INTRAVENOUS
  Filled 2022-01-06 (×2): qty 2

## 2022-01-06 MED ORDER — IOHEXOL 300 MG/ML  SOLN
100.0000 mL | Freq: Once | INTRAMUSCULAR | Status: AC | PRN
  Administered 2022-01-06: 100 mL via INTRAVENOUS

## 2022-01-06 MED ORDER — LORAZEPAM 2 MG/ML IJ SOLN: 0.0000 mg | Freq: Two times a day (BID) | INTRAMUSCULAR | Status: DC

## 2022-01-06 MED ORDER — LORAZEPAM 1 MG PO TABS: 0.0000 mg | ORAL_TABLET | Freq: Two times a day (BID) | ORAL | Status: DC

## 2022-01-06 MED ORDER — THIAMINE HCL 100 MG PO TABS
100.0000 mg | ORAL_TABLET | Freq: Every day | ORAL | Status: DC
  Administered 2022-01-09: 100 mg via ORAL
  Filled 2022-01-06: qty 1

## 2022-01-06 MED ORDER — HYDROMORPHONE HCL 1 MG/ML IJ SOLN
0.5000 mg | Freq: Once | INTRAMUSCULAR | Status: AC
  Administered 2022-01-06: 0.5 mg via INTRAVENOUS
  Filled 2022-01-06: qty 1

## 2022-01-06 MED ORDER — SODIUM CHLORIDE 0.9 % IV BOLUS
1000.0000 mL | Freq: Once | INTRAVENOUS | Status: AC
  Administered 2022-01-06: 1000 mL via INTRAVENOUS

## 2022-01-06 MED ORDER — LORAZEPAM 1 MG PO TABS: 0.0000 mg | ORAL_TABLET | Freq: Four times a day (QID) | ORAL | Status: AC

## 2022-01-06 NOTE — ED Triage Notes (Signed)
Patient is deaf - Patient's sister at bedside to translate. Per patient she has been vomiting blood x 3 days and urinating blood x today. Per sister, Patient is a heavy drinker.  ?Sclera is yellow, abd distended - complains of epigastric pain.  ?

## 2022-01-06 NOTE — ED Notes (Signed)
Shelley [name] Patients niece would like a update when she has bed placement. [phone] ?

## 2022-01-06 NOTE — ED Provider Notes (Signed)
?[facility] ?Provider Note ? ? ?CSN: [id number] ?Arrival date & time: 01/06/22  1739 ? ?  ? ?History ? ?Chief Complaint  ?Patient presents with  ? Hematuria  ? Hematemesis  ? ? ?Shelley [name] is a 61 y.o. female. ? ?61 year old female. Patient is deaf, a hospital interpreter/tablet was used for her evaluation. Brought in by family for inability to eat, constantly vomiting, also blood in the commode/hematuria with dysuria. Also upper abdominal pain, also diffuse, unable to have a bowel movement, now states has not had a bowel movement for 1 week.  ? ?Emesis is yellow, ? May have had blood in vomit 2 days ago, red in appearance. ?Sick for 1-2 years, gradually getting worse- vomiting with eating, worse today due to concern that whatever she eats is going to come right back up.  ?Seen by PCP 11/21/21, did blood work, did not have an interpreter and is unsure what happened.  ? ?A language interpreter was used (ASL).  ?Hematuria ? ? ?  ? ?Home Medications ?Prior to Admission medications   ?Medication Sig Start Date End Date Taking? Authorizing Provider  ?erythromycin ophthalmic ointment Place a 1/2 inch ribbon of ointment into the lower eyelid. 05/11/21   Coralyn Mark, NP  ?   ? ?Allergies    ?Penicillins and Tylenol [acetaminophen]   ? ?Review of Systems   ?Review of Systems ?Negative except as per HPI ?Physical Exam ?Updated Vital Signs ?BP (!) 179/98   Pulse (!) 51   Temp (!) 97.4 ?F (36.3 ?C)   Resp 17   Ht 5\' 6"  (1.676 m)   Wt 100 kg   SpO2 94%   BMI 35.58 kg/m?  ?Physical Exam ?Vitals and nursing note reviewed.  ?Constitutional:   ?   General: She is not in acute distress. ?   Appearance: She is well-developed. She is not diaphoretic.  ?HENT:  ?   Head: Normocephalic and atraumatic.  ?   Mouth/Throat:  ?   Mouth: Mucous membranes are moist.  ?Eyes:  ?   General: No scleral icterus. ?   Conjunctiva/sclera: Conjunctivae normal.  ?Cardiovascular:  ?   Rate and Rhythm: Normal rate  and regular rhythm.  ?   Heart sounds: Normal heart sounds.  ?Pulmonary:  ?   Effort: Pulmonary effort is normal.  ?   Breath sounds: Normal breath sounds.  ?Abdominal:  ?   Palpations: Abdomen is soft.  ?   Tenderness: There is generalized abdominal tenderness and tenderness in the left lower quadrant. There is no right CVA tenderness or left CVA tenderness.  ?   Comments: Moderate generalized tenderness, more severe in the left lower quadrant although through the interpreter, patient states that her pain is more periumbilical.  ?Musculoskeletal:  ?   Cervical back: Neck supple.  ?   Right lower leg: No edema.  ?   Left lower leg: No edema.  ?Skin: ?   General: Skin is warm and dry.  ?   Findings: No erythema or rash.  ?Neurological:  ?   Mental Status: She is alert and oriented to person, place, and time.  ?Psychiatric:     ?   Behavior: Behavior normal.  ? ? ?ED Results / Procedures / Treatments   ?Labs ?(all labs ordered are listed, but only abnormal results are displayed) ?Labs Reviewed  ?LIPASE, BLOOD - Abnormal; Notable for the following components:  ?    Result Value  ? Lipase 447 (*)   ?  All other components within normal limits  ?COMPREHENSIVE METABOLIC PANEL - Abnormal; Notable for the following components:  ? Potassium 2.7 (*)   ? Chloride 88 (*)   ? AST 68 (*)   ? ALT 59 (*)   ? Anion gap 20 (*)   ? All other components within normal limits  ?CBC - Abnormal; Notable for the following components:  ? Hemoglobin 16.9 (*)   ? MCV 108.8 (*)   ? MCH 40.2 (*)   ? MCHC 37.0 (*)   ? All other components within normal limits  ?URINALYSIS, ROUTINE W REFLEX MICROSCOPIC - Abnormal; Notable for the following components:  ? Hgb urine dipstick LARGE (*)   ? Bilirubin Urine MODERATE (*)   ? Ketones, ur >80 (*)   ? Protein, ur >300 (*)   ? All other components within normal limits  ?PREGNANCY, URINE  ?ETHANOL  ?MAGNESIUM  ? ? ?EKG ?EKG Interpretation ? ?Date/Time:  Friday January 06 2022 20:25:47 EDT ?Ventricular Rate:   55 ?PR Interval:  168 ?QRS Duration: 98 ?QT Interval:  616 ?QTC Calculation: 590 ?R Axis:   -30 ?Text Interpretation: Sinus rhythm Left axis deviation Anteroseptal infarct, age indeterminate Prolonged QT interval No previous ECGs available Confirmed by Vanetta MuldersZackowski, Scott [phone]([id number]) on 01/06/2022 8:34:19 PM ? ?Radiology ?CT ABDOMEN PELVIS W CONTRAST ? ?Result Date: 01/06/2022 ?CLINICAL DATA:  Abdominal pain EXAM: CT ABDOMEN AND PELVIS WITH CONTRAST TECHNIQUE: Multidetector CT imaging of the abdomen and pelvis was performed using the standard protocol following bolus administration of intravenous contrast. RADIATION DOSE REDUCTION: This exam was performed according to the departmental dose-optimization program which includes automated exposure control, adjustment of the mA and/or kV according to patient size and/or use of iterative reconstruction technique. CONTRAST:  100mL OMNIPAQUE IOHEXOL 300 MG/ML  SOLN COMPARISON:  None. FINDINGS: Lower Chest: Normal. Hepatobiliary: Mottled attenuation pattern of the liver. No focal lesion. Gallbladder normal. Pancreas: Normal pancreas. No ductal dilatation or peripancreatic fluid collection. Spleen: Normal. Adrenals/Urinary Tract: The adrenal glands are normal. No hydronephrosis, nephroureterolithiasis or solid renal mass. The urinary bladder is normal for degree of distention Stomach/Bowel: There is no hiatal hernia. Normal duodenal course and caliber. No small bowel dilatation or inflammation. No focal colonic abnormality. Normal appendix. Vascular/Lymphatic: Normal course and caliber of the major abdominal vessels. No abdominal or pelvic lymphadenopathy. Reproductive: Normal uterus. No adnexal mass. Other: None. Musculoskeletal: No bony spinal canal stenosis or focal osseous abnormality. IMPRESSION: 1. No acute abnormality of the abdomen or pelvis. 2. Model attenuation pattern of the liver, likely geographic fatty infiltration. Electronically Signed   By: Deatra RobinsonKevin  Herman M.D.   On:  01/06/2022 21:07   ? ?Procedures ?Marland Kitchen.Critical Care ?Performed by: Jeannie FendMurphy, Nicklas Mcsweeney A, PA-C ?Authorized by: Jeannie FendMurphy, Curtisha Bendix A, PA-C  ? ?Critical care provider statement:  ?  Critical care time (minutes):  30 ?  Critical care was time spent personally by me on the following activities:  Development of treatment plan with patient or surrogate, discussions with consultants, evaluation of patient's response to treatment, examination of patient, ordering and review of laboratory studies, ordering and review of radiographic studies, ordering and performing treatments and interventions, pulse oximetry, re-evaluation of patient's condition and review of old charts  ? ? ?Medications Ordered in ED ?Medications  ?potassium chloride SA (KLOR-CON M) CR tablet 40 mEq (has no administration in time range)  ?LORazepam (ATIVAN) injection 0-4 mg (has no administration in time range)  ?  Or  ?LORazepam (ATIVAN) tablet 0-4 mg (has no administration in  time range)  ?LORazepam (ATIVAN) injection 0-4 mg (has no administration in time range)  ?  Or  ?LORazepam (ATIVAN) tablet 0-4 mg (has no administration in time range)  ?thiamine tablet 100 mg (has no administration in time range)  ?  Or  ?thiamine (B-1) injection 100 mg (has no administration in time range)  ?ondansetron [facility]) injection 4 mg (4 mg Intravenous Given 01/06/22 1928)  ?sodium chloride 0.9 % bolus 1,000 mL (1,000 mLs Intravenous New Bag/Given 01/06/22 2012)  ?ondansetron [facility]) injection 4 mg (4 mg Intravenous Given 01/06/22 2012)  ?HYDROmorphone (DILAUDID) injection 0.5 mg (0.5 mg Intravenous Given 01/06/22 2014)  ?iohexol (OMNIPAQUE) 300 MG/ML solution 100 mL (100 mLs Intravenous Contrast Given 01/06/22 2044)  ? ? ?ED Course/ Medical Decision Making/ A&P ?  ? ?                        ?Medical Decision Making ?Amount and/or Complexity of Data Reviewed ?Labs: ordered. ?Radiology: ordered. ? ?Risk ?OTC drugs. ?Prescription drug management. ?Decision regarding hospitalization. ? ? ?This  patient presents to the ED for concern of abdominal pain with nausea, vomiting, hematuria, constipation, this involves an extensive number of treatment options, and is a complaint that carries with it a high risk of

## 2022-01-07 ENCOUNTER — Encounter (HOSPITAL_COMMUNITY): Payer: Self-pay | Admitting: Internal Medicine

## 2022-01-07 DIAGNOSIS — H9193 Unspecified hearing loss, bilateral: Secondary | ICD-10-CM

## 2022-01-07 DIAGNOSIS — H9190 Unspecified hearing loss, unspecified ear: Secondary | ICD-10-CM

## 2022-01-07 DIAGNOSIS — F1010 Alcohol abuse, uncomplicated: Secondary | ICD-10-CM

## 2022-01-07 DIAGNOSIS — Z800 Family history of malignant neoplasm of digestive organs: Secondary | ICD-10-CM | POA: Insufficient documentation

## 2022-01-07 DIAGNOSIS — K852 Alcohol induced acute pancreatitis without necrosis or infection: Secondary | ICD-10-CM

## 2022-01-07 DIAGNOSIS — R9431 Abnormal electrocardiogram [ECG] [EKG]: Secondary | ICD-10-CM | POA: Diagnosis present

## 2022-01-07 DIAGNOSIS — E876 Hypokalemia: Secondary | ICD-10-CM

## 2022-01-07 DIAGNOSIS — K5730 Diverticulosis of large intestine without perforation or abscess without bleeding: Secondary | ICD-10-CM | POA: Insufficient documentation

## 2022-01-07 DIAGNOSIS — R634 Abnormal weight loss: Secondary | ICD-10-CM | POA: Diagnosis present

## 2022-01-07 DIAGNOSIS — R1110 Vomiting, unspecified: Secondary | ICD-10-CM | POA: Diagnosis present

## 2022-01-07 LAB — CBC
HCT: 40.5 % (ref 36.0–46.0)
Hemoglobin: 14.9 g/dL (ref 12.0–15.0)
MCH: 40.8 pg — ABNORMAL HIGH (ref 26.0–34.0)
MCHC: 36.8 g/dL — ABNORMAL HIGH (ref 30.0–36.0)
MCV: 111 fL — ABNORMAL HIGH (ref 80.0–100.0)
Platelets: 196 10*3/uL (ref 150–400)
RBC: 3.65 MIL/uL — ABNORMAL LOW (ref 3.87–5.11)
RDW: 14.8 % (ref 11.5–15.5)
WBC: 6 10*3/uL (ref 4.0–10.5)
nRBC: 0 % (ref 0.0–0.2)

## 2022-01-07 LAB — COMPREHENSIVE METABOLIC PANEL
ALT: 50 U/L — ABNORMAL HIGH (ref 0–44)
AST: 57 U/L — ABNORMAL HIGH (ref 15–41)
Albumin: 3.4 g/dL — ABNORMAL LOW (ref 3.5–5.0)
Alkaline Phosphatase: 63 U/L (ref 38–126)
Anion gap: 14 (ref 5–15)
BUN: 7 mg/dL (ref 6–20)
CO2: 27 mmol/L (ref 22–32)
Calcium: 8.4 mg/dL — ABNORMAL LOW (ref 8.9–10.3)
Chloride: 97 mmol/L — ABNORMAL LOW (ref 98–111)
Creatinine, Ser: 0.72 mg/dL (ref 0.44–1.00)
GFR, Estimated: >60 mL/min (ref >60)
Glucose, Bld: 94 mg/dL (ref 70–99)
Potassium: 2.8 mmol/L — ABNORMAL LOW (ref 3.5–5.1)
Sodium: 138 mmol/L (ref 135–145)
Total Bilirubin: 1.2 mg/dL (ref 0.3–1.2)
Total Protein: 5.8 g/dL — ABNORMAL LOW (ref 6.5–8.1)

## 2022-01-07 LAB — VITAMIN B12: Vitamin B-12: 250 pg/mL (ref 180–914)

## 2022-01-07 LAB — PHOSPHORUS: Phosphorus: 3.3 mg/dL (ref 2.5–4.6)

## 2022-01-07 LAB — FOLATE: Folate: 1.2 ng/mL — ABNORMAL LOW (ref >5.9)

## 2022-01-07 LAB — GLUCOSE, CAPILLARY: Glucose-Capillary: 79 mg/dL (ref 70–99)

## 2022-01-07 MED ORDER — LACTATED RINGERS IV BOLUS
1000.0000 mL | Freq: Once | INTRAVENOUS | Status: AC
Start: 2022-01-07 — End: 2022-01-07
  Administered 2022-01-07: 1000 mL via INTRAVENOUS

## 2022-01-07 MED ORDER — PROCHLORPERAZINE EDISYLATE 10 MG/2ML IJ SOLN: 5.0000 mg | INTRAMUSCULAR | Status: DC | PRN

## 2022-01-07 MED ORDER — POTASSIUM CHLORIDE 10 MEQ/100ML IV SOLN
10.0000 meq | INTRAVENOUS | Status: AC
  Administered 2022-01-07 (×4): 10 meq via INTRAVENOUS
  Filled 2022-01-07 (×4): qty 100

## 2022-01-07 MED ORDER — THIAMINE HCL 100 MG/ML IJ SOLN
100.0000 mg | INTRAMUSCULAR | Status: AC
  Administered 2022-01-07: 100 mg via INTRAVENOUS
  Filled 2022-01-07: qty 2

## 2022-01-07 MED ORDER — MAGNESIUM SULFATE 2 GM/50ML IV SOLN
2.0000 g | Freq: Once | INTRAVENOUS | Status: AC
  Administered 2022-01-07: 2 g via INTRAVENOUS
  Filled 2022-01-07: qty 50

## 2022-01-07 MED ORDER — DEXTROSE-NACL 5-0.9 % IV SOLN: Freq: Once | INTRAVENOUS | Status: AC

## 2022-01-07 MED ORDER — POTASSIUM CHLORIDE IN NACL 20-0.9 MEQ/L-% IV SOLN
INTRAVENOUS | Status: DC
  Filled 2022-01-07 (×4): qty 1000

## 2022-01-07 MED ORDER — ONDANSETRON HCL 4 MG/2ML IJ SOLN
4.0000 mg | Freq: Once | INTRAMUSCULAR | Status: AC
  Administered 2022-01-07: 4 mg via INTRAVENOUS
  Filled 2022-01-07: qty 2

## 2022-01-07 MED ORDER — PANTOPRAZOLE SODIUM 40 MG IV SOLR
40.0000 mg | Freq: Two times a day (BID) | INTRAVENOUS | Status: DC
  Administered 2022-01-07 – 2022-01-09 (×4): 40 mg via INTRAVENOUS
  Filled 2022-01-07 (×4): qty 10

## 2022-01-07 MED ORDER — HYDROMORPHONE HCL 1 MG/ML IJ SOLN: 1.0000 mg | INTRAMUSCULAR | Status: DC | PRN

## 2022-01-07 MED ORDER — PANTOPRAZOLE SODIUM 40 MG IV SOLR
40.0000 mg | INTRAVENOUS | Status: DC
  Administered 2022-01-07: 40 mg via INTRAVENOUS
  Filled 2022-01-07: qty 10

## 2022-01-07 NOTE — H&P (Signed)
?History and Physical  ? ? ?Patient: Shelley [name] [id number] DOB: [birth year] ?DOA: 01/06/2022 ?DOS: the patient was seen and examined on 01/07/2022 ?PCP: Lin Landsman, MD  ?Patient coming from: Home ? ?Chief Complaint:  ?Chief Complaint  ?Patient presents with  ? Hematuria  ? Hematemesis  ? ?HPI: Shelley [name] is a 61 y.o. female with medical history significant of seasonal allergies, deafness, OSA on CPAP, thyroid disease, overweight, family members endorse significant alcohol abuse (which the patient denies), tobacco use who is brought to the emergency department due to hematemesis and hematuria associated with abdominal pain according to family members.  ED providers so that her emesis was yellow in color.  She has been vomiting often for over a year.  She used to be morbidly obese and now her weight is down to 73.5 kg with a BMI of 26.15 kg/m?Marland Kitchen  She is able to communicate with sign language or in written form.  She stated her pain was better.  She denied nausea.  No headache, chest or back pain, diarrhea, constipation or trouble urinating. ? ?ED course: Initial vital signs were temperature 97.4 ?F, pulse 93, respiration 17, BP 150/104 mmHg and O2 sat 100% on room air.  The patient received 1000 mL of sodium chloride bolus, thiamine 100 mg IVP, KCl 40 mEq p.o., KCl 10 mEq IVPB x4 hydromorphone 0.5 mg IVPB and ondansetron 4 mg IVP x3. ? ?Lab work: UDS showed large hemoglobinuria, moderate bilirubin, ketonuria more than 80 and proteinuria more than 300 mg/dL.  CBC showed that her white count was 6.0, hemoglobin 16.9 g/dL with an MCV of 108.8 fL and platelets 209.  CMP showed a potassium of 2.7 and chloride 88 mmol/L with an anion gap of 20.  AST was 68 and ALT 59 units/L.  The rest of the CMP measurements were normal.  Lipase level was 447 units/L.  Alcohol was less than 10 mg/dL. ? ?Imaging: CT abdomen/pelvis with contrast did not show any acute abnormality of the abdomen or pelvis.  There was fatty infiltration of  the liver.  Please see images and full radiology report for further details. ?  ?Review of Systems: As mentioned in the history of present illness. All other systems reviewed and are negative. ?Past Medical History:  ?Diagnosis Date  ? Allergy   ? Deaf   ? OSA on CPAP 10/03/2011  ? Thyroid disease   ? ?History reviewed. No pertinent surgical history. ?Social History:  reports that she has been smoking cigarettes. She has never used smokeless tobacco. She reports that she does not drink alcohol and does not use drugs. ? ?Allergies  ?Allergen Reactions  ? Penicillins   ?  Caused deafness per pt  ? Tylenol [Acetaminophen]   ? ? ?Family History  ?Problem Relation Age of Onset  ? Arthritis Mother   ? Diabetes Mother   ? Heart disease Father   ? Hyperlipidemia Father   ? Hypertension Father   ? Stroke Father   ? Alcohol abuse Neg Hx   ? Cancer Neg Hx   ? Early death Neg Hx   ? Kidney disease Neg Hx   ? Breast cancer Neg Hx   ? ? ?Prior to Admission medications   ?Medication Sig Start Date End Date Taking? Authorizing Provider  ?erythromycin ophthalmic ointment Place a 1/2 inch ribbon of ointment into the lower eyelid. 05/11/21   Marney Setting, NP  ? ? ?Physical Exam: ?Vitals:  ? 01/07/22 0400 01/07/22 0401 01/07/22 0430 01/07/22  0555  ?BP: 128/78 128/78 134/80 137/73  ?Pulse: (!) 59 (!) 59 (!) 55 (!) 59  ?Resp: 15 15 15 18   ?Temp:    (!) 97.5 ?F (36.4 ?C)  ?TempSrc:    Oral  ?SpO2: 99% 99% 99% 100%  ?Weight:    73.5 kg  ?Height:    5\' 6"  (1.676 m)  ? ?Physical Exam ?Vitals and nursing note reviewed.  ?HENT:  ?   Head: Normocephalic.  ?   Ears:  ?   Comments: Positive deafness. ?   Nose: Nose normal.  ?   Mouth/Throat:  ?   Mouth: Mucous membranes are dry.  ?Eyes:  ?   General: No scleral icterus. ?Neck:  ?   Vascular: No JVD.  ?Cardiovascular:  ?   Rate and Rhythm: Normal rate and regular rhythm.  ?   Heart sounds: S1 normal and S2 normal.  ?Pulmonary:  ?   Effort: Pulmonary effort is normal.  ?   Breath sounds:  Normal breath sounds.  ?Abdominal:  ?   General: Bowel sounds are normal. There is no distension.  ?   Palpations: Abdomen is soft.  ?   Tenderness: There is abdominal tenderness in the left lower quadrant. There is no guarding or rebound.  ?   Comments: Mild LLQ tenderness.  ?Musculoskeletal:  ?   Cervical back: Neck supple.  ?   Right lower leg: No edema.  ?   Left lower leg: No edema.  ?Skin: ?   General: Skin is warm and dry.  ?Neurological:  ?   General: No focal deficit present.  ?   Mental Status: She is alert and oriented to person, place, and time.  ?Psychiatric:     ?   Mood and Affect: Mood normal.     ?   Behavior: Behavior normal.  ? ? ?Data Reviewed: ? ?There are no new results to review at this time. ? ?Assessment and Plan: ?Principal Problem: ?  Acute pancreatitis ?Observation/telemetry. ?LR 2000 mL bolus given. ?Analgesics as needed. ?Antiemetics as needed. ?Begin pantoprazole 40 mg IVP q 12 hr. ?Follow-up CBC, CMP and lipase level. ?Alcohol cessation advised. ? ?Active Problems: ?  Hypokalemia ?Replenishing. ?Supplemented with magnesium. ?Follow potassium level. ? ?  Prolonged QT interval ?Correct electrolytes. ?Continue cardiac telemetry. ?Avoid QT prolonging meds. ? ?  Alcohol abuse ?On lorazepam by CIWA protocol. ?Folate, MVI and thiamine supplementation. ?Consult TOC team. ? ?  Weight loss ?In the setting of ?  Chronic vomiting ?Antiemetics as needed. ?Continue PPI as above. ?Consult GI for in or outpatient evaluation. ? ?  Sleep apnea ?Trial of CPAP at bedtime. ?  Deafness ?Use sign or written language. ?Supportive care. ? ?  Thyromegaly hx ?Check TSH level ? ? Advance Care Planning: CODE STATUS: Full code. ? ?Consults: [facility] (Dr. Michail Sermon). ? ?Family Communication:  ? ?Severity of Illness: ?The appropriate patient status for this patient is OBSERVATION. Observation status is judged to be reasonable and necessary in order to provide the required intensity of service to ensure the patient's  safety. The patient's presenting symptoms, physical exam findings, and initial radiographic and laboratory data in the context of their medical condition is felt to place them at decreased risk for further clinical deterioration. Furthermore, it is anticipated that the patient will be medically stable for discharge from the hospital within 2 midnights of admission.  ? ?Author: ?Reubin Milan, MD ?01/07/2022 7:55 AM ? ?For on call review [email or web address].  ? ?This document  was prepared using Systems analyst and may contain some unintended transcription errors. ?

## 2022-01-07 NOTE — ED Notes (Signed)
Rounded on pt. Pt currently lying in bed with eyes close. VSS; no signs of distress. Will continue to monitor  ?

## 2022-01-07 NOTE — Progress Notes (Signed)
Pt arrived from [facility] via [facility]. Pt oriented to room/floor. Placed on telemetry. Utilized Stratus to complete admission questions. Pt denies any further needs at this time. ?

## 2022-01-07 NOTE — ED Notes (Signed)
Called [facility] to transport patient to [facility] room# 1401 ?

## 2022-01-07 NOTE — ED Notes (Signed)
?  Pt stated last drink 1 month ago ?

## 2022-01-07 NOTE — Consult Note (Signed)
Referring Provider: Dr. Robb Matar ?Primary Care Physician:  Leilani Able, MD ?Primary Gastroenterologist: Dr. Loreta Ave ? ?Reason for Consultation:  Pancreatitis; Nausea/Vomiting ? ?HPI: MAFALDA MCGINNISS is a 61 y.o. female transferred from [facility] ER with acute pancreatitis and recurrent vomiting. Had multiple episodes of vomiting yesterday that was clear (nonbloody). At home reportedly had "frothy" vomiting with red color to it but niece unsure on how much. Emesis at [facility] was reported as yellow in color. She was having abdominal pain as well per her family, which she denies. Denies vomiting today or abdominal pain. 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 Negative alcohol level. CT negative for acute findings. Lipase 447, TB 1.2, ALP 63, AST 57, ALT 50. Two nieces and nurse in room during my evaluation. ? ?Past Medical History:  ?Diagnosis Date  ? Allergy   ? Deaf   ? OSA on CPAP 10/03/2011  ? Thyroid disease   ? ? ?History reviewed. No pertinent surgical history. ? ?Prior to Admission medications   ?Not on File  ? ? ?Scheduled Meds: ? LORazepam  0-4 mg Intravenous Q6H  ? Or  ? LORazepam  0-4 mg Oral Q6H  ? [START ON 01/09/2022] LORazepam  0-4 mg Intravenous Q12H  ? Or  ? [START ON 01/09/2022] LORazepam  0-4 mg Oral Q12H  ? pantoprazole (PROTONIX) IV  40 mg Intravenous Q12H  ? thiamine  100 mg Oral Daily  ? Or  ? thiamine  100 mg Intravenous Daily  ? ?Continuous Infusions: ? 0.9 % NaCl with KCl 20 mEq / L 125 mL/hr at 01/07/22 1051  ? ?PRN Meds:.HYDROmorphone (DILAUDID) injection, prochlorperazine ? ?Allergies as of 01/06/2022 - Review Complete 01/06/2022  ?Allergen Reaction Noted  ? Penicillins  09/12/2012  ? Tylenol [acetaminophen]  05/11/2021  ? ? ?Family History  ?Problem Relation Age of Onset  ? Arthritis Mother   ? Diabetes Mother   ? Heart disease Father   ? Hyperlipidemia Father   ? Hypertension Father   ? Stroke  Father   ? Alcohol abuse Neg Hx   ? Cancer Neg Hx   ? Early death Neg Hx   ? Kidney disease Neg Hx   ? Breast cancer Neg Hx   ? ? ?Social History  ? ?Socioeconomic History  ? Marital status: Single  ?  Spouse name: Not on file  ? Number of children: Not on file  ? Years of education: Not on file  ? Highest education level: Not on file  ?Occupational History  ? Not on file  ?Tobacco Use  ? Smoking status: Every Day  ?  Types: Cigarettes  ? Smokeless tobacco: Never  ?Substance and Sexual Activity  ? Alcohol use: No  ? Drug use: No  ? Sexual activity: Never  ?Other Topics Concern  ? Not on file  ?Social History Narrative  ? Not on file  ? ?Social Determinants of Health  ? ?Financial Resource Strain: Not on file  ?Food Insecurity: Not on file  ?Transportation Needs: Not on file  ?Physical Activity: Not on file  ?Stress: Not on file  ?Social Connections: Not on file  ?Intimate Partner Violence: Not on file  ? ? ?Review of Systems: All negative except as stated above in HPI. ? ?Physical Exam: ?Vital signs: ?Vitals:  ? 01/07/22 0555 01/07/22 1421  ?BP: 137/73 137/70  ?Pulse: (!) 59 (!) 59  ?Resp: 18 18  ?Temp: (!) 97.5 ?F (  36.4 ?C) 98.1 ?F (36.7 ?C)  ?SpO2: 100% 100%  ? ?Last BM Date :  (1 week ago, per pt) ?General:   Lethargic, chronically-ill appearing, no acute distress, pleasant  ?Head: normocephalic, atraumatic ?Eyes: anicteric sclera ?ENT: oropharynx clear ?Neck: supple, nontender ?Lungs:  Clear throughout to auscultation.   No wheezes, crackles, or rhonchi. No acute distress. ?Heart:  Regular rate and rhythm; no murmurs, clicks, rubs,  or gallops. ?Abdomen: soft, nontender, nondistended, +BS  ?Rectal:  Deferred ?Ext: no edema ?Neuro: oriented X 3 ? ?GI:  ?Lab Results: ?Recent Labs  ?  01/06/22 ?1758 01/07/22 ?0753  ?WBC 6.0 6.0  ?HGB 16.9* 14.9  ?HCT 45.7 40.5  ?PLT 209 196  ? ?BMET ?Recent Labs  ?  01/06/22 ?1758 01/07/22 ?0753  ?NA 137 138  ?K 2.7* 2.8*  ?CL 88* 97*  ?CO2 29 27  ?GLUCOSE 94 94  ?BUN 7 7   ?CREATININE 0.68 0.72  ?CALCIUM 9.8 8.4*  ? ?LFT ?Recent Labs  ?  01/07/22 ?0753  ?PROT 5.8*  ?ALBUMIN 3.4*  ?AST 57*  ?ALT 50*  ?ALKPHOS 63  ?BILITOT 1.2  ? ?PT/INR ?No results for input(s): LABPROT, INR in the last 72 hours. ? ? ?Studies/Results: ?CT ABDOMEN PELVIS W CONTRAST ? ?Result Date: 01/06/2022 ?CLINICAL DATA:  Abdominal pain EXAM: CT ABDOMEN AND PELVIS WITH CONTRAST TECHNIQUE: Multidetector CT imaging of the abdomen and pelvis was performed using the standard protocol following bolus administration of intravenous contrast. RADIATION DOSE REDUCTION: This exam was performed according to the departmental dose-optimization program which includes automated exposure control, adjustment of the mA and/or kV according to patient size and/or use of iterative reconstruction technique. CONTRAST:  OMNIPAQUE IOHEXOL 300 MG/ML  SOLN COMPARISON:  None. FINDINGS: Lower Chest: Normal. Hepatobiliary: Mottled attenuation pattern of the liver. No focal lesion. Gallbladder normal. Pancreas: Normal pancreas. No ductal dilatation or peripancreatic fluid collection. Spleen: Normal. Adrenals/Urinary Tract: The adrenal glands are normal. No hydronephrosis, nephroureterolithiasis or solid renal mass. The urinary bladder is normal for degree of distention Stomach/Bowel: There is no hiatal hernia. Normal duodenal course and caliber. No small bowel dilatation or inflammation. No focal colonic abnormality. Normal appendix. Vascular/Lymphatic: Normal course and caliber of the major abdominal vessels. No abdominal or pelvic lymphadenopathy. Reproductive: Normal uterus. No adnexal mass. Other: None. Musculoskeletal: No bony spinal canal stenosis or focal osseous abnormality. IMPRESSION: 1. No acute abnormality of the abdomen or pelvis. 2. Model attenuation pattern of the liver, likely geographic fatty infiltration. Electronically Signed   By: Deatra Robinson M.D.   On: 01/06/2022 21:07   ? ?Impression/Plan: ?Acute pancreatitis likely  due to alcohol - bloody-tinged vomit likely due to irritation of her oropharynx or esophagitis from her profuse vomiting and doubt a peptic ulcer. Bowel rest. IVFs. Continue Protonix 40 mg IV Q 12 hours. EGD not needed at this time. Needs to stop drinking alcohol. Patient's niece reports that she has been seen by Dr. Kenna Gilbert office so f/u should be with their office. Will sign off. Call if questions. ? ? LOS: 0 days  ? ?Shirley Friar  01/07/2022, 3:38 PM ? ?Questions please call [phone]  ? ?

## 2022-01-07 NOTE — ED Notes (Signed)
Sign language interpreter used to score CIWA ?

## 2022-01-07 NOTE — ED Notes (Signed)
Report received and care resumed.

## 2022-01-07 NOTE — Progress Notes (Signed)
Plan of Care Note for accepted transfer ? ? ?Patient: Shelley [name] MRN: [id number]   DOA: 01/06/2022 ? ?Facility requesting transfer: [facility] ?Requesting Provider: PA Suella Broad ?Reason for transfer: Pancreatitis  ?Facility course:  ? ?61 year old deaf female with past medical history of obstructive sleep apnea presenting to [facility] due to concerns for hematemesis and hematuria. ? ?ER provider reports that obtaining history from the patient and family was extremely difficult due to the patient being deaf.  An interpreter was used throughout the interview however this was impeded by technical difficulties. ? ?ER provider states that the family reports the patient drinks heavily however the patient adamantly denies this.  The patient apparently is reporting inability to tolerate oral intake with vomiting, blood in her stool as well as dysuria. ? ?Work-up in the emergency department revealed a somewhat elevated lipase of 447.  While CT imaging of the abdomen and pelvis did not reveal any definitive evidence of pancreatitis ER provider feels that the patient's presentation is clinically consistent with acute pancreatitis, presumably secondary to alcohol use that the patient is currently denying.  Patient was found to have significant hypokalemia consistent with gastrointestinal losses with potassium of 2.7.  Patient was additionally found to have a markedly prolonged QTc on initial EKG of 590 ms. ? ?Due to the patient's inability to tolerate oral intake and his diagnosis of pancreatitis ER providers requesting hospitalization. ? ?Plan of care: ?The patient is accepted for admission to Telemetry unit, at [facility]..  ? ? ?Author: ?Vernelle Emerald, MD ?01/07/2022 ? ?Check [email or web address] for on-call coverage. ? ?Nursing staff, Please call [place] number on Amion as soon as patient's arrival, so appropriate admitting provider can evaluate the pt. ?

## 2022-01-08 ENCOUNTER — Encounter (HOSPITAL_COMMUNITY): Payer: Self-pay | Admitting: Internal Medicine

## 2022-01-08 DIAGNOSIS — E538 Deficiency of other specified B group vitamins: Secondary | ICD-10-CM

## 2022-01-08 DIAGNOSIS — E162 Hypoglycemia, unspecified: Secondary | ICD-10-CM

## 2022-01-08 LAB — COMPREHENSIVE METABOLIC PANEL
ALT: 39 U/L (ref 0–44)
AST: 49 U/L — ABNORMAL HIGH (ref 15–41)
Albumin: 2.9 g/dL — ABNORMAL LOW (ref 3.5–5.0)
Alkaline Phosphatase: 54 U/L (ref 38–126)
Anion gap: 10 (ref 5–15)
BUN: <5 mg/dL — ABNORMAL LOW (ref 6–20)
CO2: 26 mmol/L (ref 22–32)
Calcium: 7.9 mg/dL — ABNORMAL LOW (ref 8.9–10.3)
Chloride: 101 mmol/L (ref 98–111)
Creatinine, Ser: 0.55 mg/dL (ref 0.44–1.00)
GFR, Estimated: >60 mL/min (ref >60)
Glucose, Bld: 72 mg/dL (ref 70–99)
Potassium: 2.9 mmol/L — ABNORMAL LOW (ref 3.5–5.1)
Sodium: 137 mmol/L (ref 135–145)
Total Bilirubin: 1.2 mg/dL (ref 0.3–1.2)
Total Protein: 5.1 g/dL — ABNORMAL LOW (ref 6.5–8.1)

## 2022-01-08 LAB — CBC
HCT: 34.9 % — ABNORMAL LOW (ref 36.0–46.0)
Hemoglobin: 12.9 g/dL (ref 12.0–15.0)
MCH: 41.3 pg — ABNORMAL HIGH (ref 26.0–34.0)
MCHC: 37 g/dL — ABNORMAL HIGH (ref 30.0–36.0)
MCV: 111.9 fL — ABNORMAL HIGH (ref 80.0–100.0)
Platelets: 171 10*3/uL (ref 150–400)
RBC: 3.12 MIL/uL — ABNORMAL LOW (ref 3.87–5.11)
RDW: 14.9 % (ref 11.5–15.5)
WBC: 3.6 10*3/uL — ABNORMAL LOW (ref 4.0–10.5)
nRBC: 0 % (ref 0.0–0.2)

## 2022-01-08 LAB — PROTIME-INR
INR: 1 (ref 0.8–1.2)
Prothrombin Time: 13.1 seconds (ref 11.4–15.2)

## 2022-01-08 LAB — LIPASE, BLOOD: Lipase: 381 U/L — ABNORMAL HIGH (ref 11–51)

## 2022-01-08 LAB — GLUCOSE, CAPILLARY
Glucose-Capillary: 111 mg/dL — ABNORMAL HIGH (ref 70–99)
Glucose-Capillary: 124 mg/dL — ABNORMAL HIGH (ref 70–99)
Glucose-Capillary: 131 mg/dL — ABNORMAL HIGH (ref 70–99)
Glucose-Capillary: 161 mg/dL — ABNORMAL HIGH (ref 70–99)
Glucose-Capillary: 63 mg/dL — ABNORMAL LOW (ref 70–99)
Glucose-Capillary: 92 mg/dL (ref 70–99)

## 2022-01-08 LAB — TSH: TSH: 1.436 u[IU]/mL (ref 0.350–4.500)

## 2022-01-08 LAB — HIV ANTIBODY (ROUTINE TESTING W REFLEX): HIV Screen 4th Generation wRfx: NONREACTIVE

## 2022-01-08 LAB — POTASSIUM: Potassium: 4.4 mmol/L (ref 3.5–5.1)

## 2022-01-08 LAB — MAGNESIUM: Magnesium: 1.8 mg/dL (ref 1.7–2.4)

## 2022-01-08 MED ORDER — CYANOCOBALAMIN 1000 MCG/ML IJ SOLN
1000.0000 ug | Freq: Once | INTRAMUSCULAR | Status: AC
  Administered 2022-01-08: 1000 ug via INTRAMUSCULAR
  Filled 2022-01-08: qty 1

## 2022-01-08 MED ORDER — POTASSIUM CHLORIDE 2 MEQ/ML IV SOLN: INTRAVENOUS | Status: DC

## 2022-01-08 MED ORDER — SODIUM CHLORIDE 0.9 % IV SOLN: 1.0000 mg | Freq: Every day | INTRAVENOUS | Status: DC

## 2022-01-08 MED ORDER — DEXTROSE 50 % IV SOLN
INTRAVENOUS | Status: AC
  Administered 2022-01-08: 12.5 g via INTRAVENOUS
  Filled 2022-01-08: qty 50

## 2022-01-08 MED ORDER — FOLIC ACID 5 MG/ML IJ SOLN
1.0000 mg | Freq: Every day | INTRAMUSCULAR | Status: DC
  Administered 2022-01-08 – 2022-01-09 (×2): 1 mg via INTRAVENOUS
  Filled 2022-01-08 (×2): qty 0.2

## 2022-01-08 MED ORDER — KCL IN DEXTROSE-NACL 20-5-0.9 MEQ/L-%-% IV SOLN
INTRAVENOUS | Status: DC
  Filled 2022-01-08 (×4): qty 1000

## 2022-01-08 MED ORDER — POTASSIUM CHLORIDE 10 MEQ/100ML IV SOLN
10.0000 meq | INTRAVENOUS | Status: AC
  Administered 2022-01-08 (×4): 10 meq via INTRAVENOUS
  Filled 2022-01-08 (×4): qty 100

## 2022-01-08 MED ORDER — DEXTROSE 50 % IV SOLN: 12.5000 g | INTRAVENOUS | Status: AC

## 2022-01-08 MED ORDER — POTASSIUM CHLORIDE 20 MEQ PO PACK
40.0000 meq | PACK | Freq: Once | ORAL | Status: AC
  Administered 2022-01-08: 40 meq via ORAL
  Filled 2022-01-08: qty 2

## 2022-01-08 NOTE — Hospital Course (Signed)
61 y.o. female with seasonal allergies, deafness, OSA on CPAP, thyroid disease, overweight,significant alcohol abuse (which the patient denies), tobacco brought to the ED  due to multiple episodes of vomiting, abdominal pain and hematuria according to family members. ?As per ED her emesis was yellow in color.She has been vomiting often for over a year. She used to be morbidly obese and now her weight is down to 73.5 kg with a BMI of 26.15 kg/m?Marland Kitchen  She is able to communicate with sign language or in written form.  In the ED CT abdomen negative for acute finding, lipase 447 with macrocytosis, mildly elevated LFTs, hypokalemia. ?GI was consulted and admitted for further management ?

## 2022-01-08 NOTE — Progress Notes (Signed)
?PROGRESS NOTE ?Shelley [name]  [id number] DOB: 25-Jun-1961 DOA: 01/06/2022 ?PCP: Leilani Able, MD  ? ?Brief Narrative/Hospital Course: ?61 y.o. female with seasonal allergies, deafness, OSA on CPAP, thyroid disease, overweight,significant alcohol abuse (which the patient denies), tobacco brought to the ED  due to multiple episodes of vomiting, abdominal pain and hematuria according to family members. ?As per ED her emesis was yellow in color.She has been vomiting often for over a year. She used to be morbidly obese and now her weight is down to 73.5 kg with a BMI of 26.15 kg/m?Marland Kitchen  She is able to communicate with sign language or in written form.  In the ED CT abdomen negative for acute finding, lipase 447 with macrocytosis, mildly elevated LFTs, hypokalemia. ?GI was consulted and admitted for further management  ?  ?Subjective: ?Seen examined this morning.  Nursing at the bedside communicating with writing ?Denies nausea vomiting no abdominal pain or tenderness ?No tremors. ? ?Assessment and Plan: ?Principal Problem: ?  Acute pancreatitis ?Active Problems: ?  Thyromegaly ?  Sleep apnea ?  Prolonged QT interval ?  Deafness ?  Hypokalemia ?  Weight loss ?  Chronic vomiting ?  Alcohol abuse ?  Folate deficiency ?  Hypoglycemia ?  ?Acute pancreatitis, likely EtOH related: Lipase downtrending continue with symptomatic management IV fluid hydration pain control antiemetics, PPI,clear liquid diet, and ADAT.  Continue pain control. ? ?Hypokalemia-replete ? ?Hypoglycemia: In the setting of pancreatitis vomiting NPO.  Change ivf to D5W, start diet.monitor ? ?Weight loss ?Chronic vomiting ?Blood-tinged vomitus: ?Chronic vomiting issues with weight loss.Has had blood-tinged vomit seen by GI-not planning for CT patient.Outpatient GI follow-up W/ Dr Loreta Ave.Augment nutritional status as able.Monitor hb ?Recent Labs  ?Lab 01/06/22 ?1758 01/07/22 ?0753 01/08/22 ?0543  ?HGB 16.9* 14.9 12.9  ?HCT 45.7 40.5 34.9*  ? ?Alcohol abuse as per  the family members but patient denies,monitor closely for withdrawal symptoms,ciwa ataivan, thiamine folate. ? ?Folate deficiency  ?B12 insufficiency: ?Start B12 and folic acid replacement. ? ?Thyromegaly:check tsh ?Sleep apnea-CPAP  trial. ?Prolonged QT interval: Monitor.  Replete electrolytes ?Deafness: Needing one-to-one communicative ? ?DVT prophylaxis: SCDs Start: 01/07/22 0757 ?Code Status:   Code Status: Full Code ?Family Communication: plan of care discussed with patient at bedside. ?Patient status is: INPATIENT Level of care: Telemetry  ?Remains inpatient because: ongoing management of acute pancreatitis. ?Patient currently not stable ?Dispo:The patient is from: Home ?          Anticipated disposition: home ? ?Mobility Assessment (last 72 hours)   ? ? Mobility Assessment   ? ? Row Name 01/07/22 2129 01/07/22 0800 01/07/22 0600  ?  ?  ? Does patient have an order for bedrest or is patient medically unstable No - Continue assessment No - Continue assessment No - Continue assessment    ? What is the highest level of mobility based on the progressive mobility assessment? Level 4 (Walks with assist in room) - Balance while marching in place and cannot step forward and back - Complete Level 4 (Walks with assist in room) - Balance while marching in place and cannot step forward and back - Complete Level 4 (Walks with assist in room) - Balance while marching in place and cannot step forward and back - Complete    ? ?  ?  ? ?  ?  ? ?Objective: ?Vitals last 24 hrs: ?Vitals:  ? 01/07/22 0555 01/07/22 1421 01/07/22 1911 01/08/22 5053  ?BP: 137/73 137/70 138/74 106/80  ?Pulse: (!) 59 (!) 59 Marland Kitchen)  57 (!) 52  ?Resp: 18 18 16 20   ?Temp: (!) 97.5 ?F (36.4 ?C) 98.1 ?F (36.7 ?C) 99.4 ?F (37.4 ?C) 98 ?F (36.7 ?C)  ?TempSrc: Oral Oral Oral Oral  ?SpO2: 100% 100% 98% 97%  ?Weight: 73.5 kg     ?Height: 5\' 6"  (1.676 m)     ? ?Weight change:  ? ?Physical Examination: ?General exam: AA oriented, deaf,older than stated age,weak  appearing. ?HEENT:Oral mucosa moist,Ear/Nose WNL,grossly,dentition normal. ?Respiratory system: bilaterally diminished BS,no use of accessory muscle. ?Cardiovascular system:S1 & S2 +,No JVD. ?Gastrointestinal system:Abdomen soft,non tender,ND,BS+. ?Nervous System:Alert, awake, moving extremities and grossly non-focal. ?Extremities:LE edema none,distal peripheral pulses palpable.  ?Skin:No rashes,no icterus. ? muscle bulk,tone, power. ? ?Medications reviewed:  ?Scheduled Meds: ? folic acid  1 mg Intravenous Daily  ? LORazepam  0-4 mg Intravenous Q6H  ? Or  ? LORazepam  0-4 mg Oral Q6H  ? [START ON 01/09/2022] LORazepam  0-4 mg Intravenous Q12H  ? Or  ? [START ON 01/09/2022] LORazepam  0-4 mg Oral Q12H  ? pantoprazole (PROTONIX) IV  40 mg Intravenous Q12H  ? thiamine  100 mg Oral Daily  ? Or  ? thiamine  100 mg Intravenous Daily  ?Continuous Infusions: ? dextrose 5 % and 0.9 % NaCl with KCl 20 mEq/L 125 mL/hr at 01/08/22 0906  ? potassium chloride 10 mEq (01/08/22 0940)  ? ? ?  ?Diet Order   ? ?       ?  Diet clear liquid Room service appropriate? Yes; Fluid consistency: Thin  Diet effective now       ?  ? ?  ?  ? ?  ?  ? ?  ?  ?  ? ? ?Intake/Output Summary (Last 24 hours) at 01/08/2022 1040 ?Last data filed at 01/08/2022 0518 ?Gross per 24 hour  ?Intake 2691.95 ml  ?Output --  ?Net 2691.95 ml  ? ?Net IO Since Admission: 3,100.89 mL [01/08/22 1040]  ?Wt Readings from Last 3 Encounters:  ?01/07/22 73.5 kg  ?05/19/13 118.4 kg  ?10/24/12 118.4 kg  ?  ? ?Unresulted Labs (From admission, onward)  ? ?  Start     Ordered  ? 01/08/22 1500  Potassium  Once-Timed,   TIMED       ? 01/08/22 0801  ? 01/08/22 0824  TSH  Add-on,   AD       ? 01/08/22 03/10/22  ? 01/08/22 0500  Lipase, blood  Daily,   R     ?Question:  Release to patient  Answer:  Immediate  ? 01/07/22 0729  ? 01/08/22 0500  HIV Antibody (routine testing w rflx)  (HIV Antibody (Routine testing w reflex) panel)  Tomorrow morning,   R       ? 01/07/22 0808  ? 01/07/22  0730  Comprehensive metabolic panel  Daily,   R     ? 01/07/22 0729  ? 01/07/22 0730  CBC  Daily,   R     ? 01/07/22 0729  ? ?  ?  ? ?  ?Data Reviewed: I have personally reviewed following labs and imaging studies ?CBC: ?Recent Labs  ?Lab Jan 24, 2022 ?1758 01/07/22 ?0753 01/08/22 ?0543  ?WBC 6.0 6.0 3.6*  ?HGB 16.9* 14.9 12.9  ?HCT 45.7 40.5 34.9*  ?MCV 108.8* 111.0* 111.9*  ?PLT 209 196 171  ? ?Basic Metabolic Panel: ?Recent Labs  ?Lab 01/24/22 ?1758 01/07/22 ?0753 01/08/22 ?0543  ?NA 137 138 137  ?K 2.7* 2.8* 2.9*  ?CL 88* 97* 101  ?  CO2 29 27 26   ?GLUCOSE 94 94 72  ?BUN 7 7 <5*  ?CREATININE 0.68 0.72 0.55  ?CALCIUM 9.8 8.4* 7.9*  ?MG 2.0  --  1.8  ?PHOS  --  3.3  --   ? ?GFR: ?Estimated Creatinine Clearance: 77.7 mL/min (by C-G formula based on SCr of 0.55 mg/dL). ?Liver Function Tests: ?Recent Labs  ?Lab 01/06/22 ?1758 01/07/22 ?0753 01/08/22 ?0543  ?AST 68* 57* 49*  ?ALT 59* 50* 39  ?ALKPHOS 78 63 54  ?BILITOT 1.1 1.2 1.2  ?PROT 7.0 5.8* 5.1*  ?ALBUMIN 4.2 3.4* 2.9*  ? ?Recent Labs  ?Lab 01/06/22 ?1758 01/08/22 ?0543  ?LIPASE 447* 381*  ? ?No results for input(s): AMMONIA in the last 168 hours. ?Coagulation Profile: ?Recent Labs  ?Lab 01/08/22 ?0543  ?INR 1.0  ? ?BNP (last 3 results) ?No results for input(s): PROBNP in the last 8760 hours. ?HbA1C: ?No results for input(s): HGBA1C in the last 72 hours. ?CBG: ?Recent Labs  ?Lab 01/07/22 ?1825 01/08/22 ?0008 01/08/22 ?[id number] 01/08/22 ?[id number]  ?GLUCAP 79 63* 111* 92  ? ?Lipid Profile: ?No results for input(s): CHOL, HDL, LDLCALC, TRIG, CHOLHDL, LDLDIRECT in the last 72 hours. ?Thyroid Function Tests: ?No results for input(s): TSH, T4TOTAL, FREET4, T3FREE, THYROIDAB in the last 72 hours. ?Sepsis Labs: ?No results for input(s): PROCALCITON, LATICACIDVEN in the last 168 hours. ? ?No results found for this or any previous visit (from the past 240 hour(s)).  ?Antimicrobials: ?Anti-infectives (From admission, onward)  ? ? None  ? ?  ? ?Culture/Microbiology ?No results found for:  SDES, SPECREQUEST, CULT, REPTSTATUS  ?Other culture-see note  ?Radiology Studies: ?CT ABDOMEN PELVIS W CONTRAST ? ?Result Date: 01/06/2022 ?CLINICAL DATA:  Abdominal pain EXAM: CT ABDOMEN AND PELVIS WITH CONTR

## 2022-01-08 NOTE — Progress Notes (Addendum)
Pt CBG of 62. Provider notified and hypoglycemic protocol initiated. Pt given IV dextrose. Pt reports no symptoms at this time. Interpreter utilized. Will recheck CBG. ?

## 2022-01-09 ENCOUNTER — Encounter (HOSPITAL_COMMUNITY): Payer: Self-pay | Admitting: *Deleted

## 2022-01-09 DIAGNOSIS — E440 Moderate protein-calorie malnutrition: Secondary | ICD-10-CM | POA: Insufficient documentation

## 2022-01-09 LAB — COMPREHENSIVE METABOLIC PANEL
ALT: 36 U/L (ref 0–44)
AST: 41 U/L (ref 15–41)
Albumin: 3 g/dL — ABNORMAL LOW (ref 3.5–5.0)
Alkaline Phosphatase: 56 U/L (ref 38–126)
Anion gap: 6 (ref 5–15)
BUN: <5 mg/dL — ABNORMAL LOW (ref 6–20)
CO2: 25 mmol/L (ref 22–32)
Calcium: 8.4 mg/dL — ABNORMAL LOW (ref 8.9–10.3)
Chloride: 104 mmol/L (ref 98–111)
Creatinine, Ser: 0.47 mg/dL (ref 0.44–1.00)
GFR, Estimated: >60 mL/min (ref >60)
Glucose, Bld: 129 mg/dL — ABNORMAL HIGH (ref 70–99)
Potassium: 3.7 mmol/L (ref 3.5–5.1)
Sodium: 135 mmol/L (ref 135–145)
Total Bilirubin: 0.7 mg/dL (ref 0.3–1.2)
Total Protein: 5.1 g/dL — ABNORMAL LOW (ref 6.5–8.1)

## 2022-01-09 LAB — CBC
HCT: 36.9 % (ref 36.0–46.0)
Hemoglobin: 13.4 g/dL (ref 12.0–15.0)
MCH: 40.4 pg — ABNORMAL HIGH (ref 26.0–34.0)
MCHC: 36.3 g/dL — ABNORMAL HIGH (ref 30.0–36.0)
MCV: 111.1 fL — ABNORMAL HIGH (ref 80.0–100.0)
Platelets: 176 10*3/uL (ref 150–400)
RBC: 3.32 MIL/uL — ABNORMAL LOW (ref 3.87–5.11)
RDW: 14.6 % (ref 11.5–15.5)
WBC: 4.8 10*3/uL (ref 4.0–10.5)
nRBC: 0 % (ref 0.0–0.2)

## 2022-01-09 LAB — GLUCOSE, CAPILLARY
Glucose-Capillary: 143 mg/dL — ABNORMAL HIGH (ref 70–99)
Glucose-Capillary: 121 mg/dL — ABNORMAL HIGH (ref 70–99)
Glucose-Capillary: 109 mg/dL — ABNORMAL HIGH (ref 70–99)

## 2022-01-09 LAB — LIPASE, BLOOD: Lipase: 540 U/L — ABNORMAL HIGH (ref 11–51)

## 2022-01-09 MED ORDER — PANTOPRAZOLE SODIUM 40 MG PO TBEC
40.0000 mg | DELAYED_RELEASE_TABLET | Freq: Every day | ORAL | 0 refills | Status: DC
Start: 2022-01-09 — End: 2024-05-21

## 2022-01-09 MED ORDER — THIAMINE HCL 100 MG PO TABS
100.0000 mg | ORAL_TABLET | Freq: Every day | ORAL | 0 refills | Status: AC
Start: 2022-01-10 — End: 2022-02-09

## 2022-01-09 MED ORDER — CYANOCOBALAMIN 500 MCG PO TABS: 500.0000 ug | ORAL_TABLET | Freq: Every day | ORAL | 0 refills | Status: AC

## 2022-01-09 MED ORDER — ADULT MULTIVITAMIN W/MINERALS CH: 1.0000 | ORAL_TABLET | Freq: Every day | ORAL | Status: DC

## 2022-01-09 MED ORDER — MULTI-VITAMIN/MINERALS PO TABS: 1.0000 | ORAL_TABLET | Freq: Every day | ORAL | 0 refills | Status: AC

## 2022-01-09 MED ORDER — ONDANSETRON HCL 4 MG PO TABS: 4.0000 mg | ORAL_TABLET | Freq: Three times a day (TID) | ORAL | 0 refills | Status: DC | PRN

## 2022-01-09 MED ORDER — FOLIC ACID 1 MG PO TABS
1.0000 mg | ORAL_TABLET | Freq: Every day | ORAL | 0 refills | Status: AC
Start: 2022-01-09 — End: 2022-02-08

## 2022-01-09 NOTE — Progress Notes (Signed)
Initial Nutrition Assessment ? ?DOCUMENTATION CODES:  ? ?Non-severe (moderate) malnutrition in context of social or environmental circumstances ? ?INTERVENTION:  ?- will enter Low Fiber Nutrition Therapy handout from the Academy of [employer] in AVS.  ?- will order 1 tablet multivitamin with minerals daily. ? ? ?NUTRITION DIAGNOSIS:  ? ?Moderate Malnutrition related to social / environmental circumstances as evidenced by mild fat depletion, mild muscle depletion.  ? ?GOAL:  ? ?Patient will meet greater than or equal to 90% of their needs ? ?MONITOR:  ? ?Weight trends, Labs, PO intake, I & O's ? ?REASON FOR ASSESSMENT:  ? ?Malnutrition Screening Tool ? ?ASSESSMENT:  ? ?61 y.o. female with seasonal allergies, deafness, OSA on CPAP, thyroid disease, significant alcohol abuse (which the patient denies), and tobacco abuse. She presented due to N/V, abdominal pain, and hematuria. She was admitted with pancreatitis. ? ?Patient utilizes ASL to communicate. Used video interpreter Merry Proud: #[id number]). ? ?Patient lives alone but does have family support including her niece, who is at bedside. For a prolonged amount of time (she is unsure how long) she has been experiencing chronic vomiting that she is not able to attribute to anything in particular and it occurs at random.  ? ?She has been consuming liquids throughout the day without issue, no abdominal pain or nausea today. She has not tried any solid food yet but is aware that diet was advanced to Soft after breakfast this AM.  ? ?Talked with patient about current diet order and things to monitor at home.  ? ?Patient shares that she has no chewing or swallowing issues at baseline.  ? ?She shares that she has been losing a lot of weight over the past few months. She shares that weight on February was 179 lb. Weight on 4/8 was 162 lb. This indicates 17 lb weight loss (9.5% body weight) in the past 2 months; significant for time frame. ? ?At the end of visit, patient  went to the bathroom and her niece shared that patient has been consuming large quantities of broth and [employer]; no other intakes.  ? ?During conversation, patient had shared that urine has been dark, that she has felt "off"/foggy headed, and overall does not feel like herself.  ? ? ?Labs reviewed; CBGs: 143 and 121 mg/dl, BUN: <5 mg/dl, lipase: 938 u/l (up from 4/9). ?Medications reviewed; 1 mg IV folic acid/day, 40 mg IV protonix BID, 100 mg thiamine/day.  ?  ? ?NUTRITION - FOCUSED PHYSICAL EXAM: ? ?Flowsheet Row Most Recent Value  ?Orbital Region No depletion  ?Upper Arm Region Mild depletion  ?Thoracic and Lumbar Region Unable to assess  ?Buccal Region Mild depletion  ?Temple Region Mild depletion  ?Clavicle Bone Region Moderate depletion  ?Clavicle and Acromion Bone Region Mild depletion  ?Scapular Bone Region Mild depletion  ?Dorsal Hand No depletion  ?Patellar Region No depletion  ?Anterior Thigh Region No depletion  ?Posterior Calf Region No depletion  ?Edema (RD Assessment) None  ?Hair Reviewed  ?Eyes Reviewed  ?Mouth Reviewed  ?Skin Reviewed  ?Nails Reviewed  ? ?  ? ? ?Diet Order:   ?Diet Order   ? ?       ?  DIET SOFT Room service appropriate? Yes; Fluid consistency: Thin  Diet effective now       ?  ?  Diet - low sodium heart healthy       ?  ? ?  ?  ? ?  ? ? ?EDUCATION NEEDS:  ? ?Education  needs have been addressed ? ?Skin:  Skin Assessment: Reviewed RN Assessment ? ?Last BM:  PTA/unknown ? ?Height:  ? ?Ht Readings from Last 1 Encounters:  ?01/07/22 5\' 6"  (1.676 m)  ? ? ?Weight:  ? ?Wt Readings from Last 1 Encounters:  ?01/07/22 73.5 kg  ? ? ? ?BMI:  Body mass index is 26.15 kg/m?. ? ?Estimated Nutritional Needs:  ?Kcal:  2100-2350 kcal ?Protein:  105-120 grams ?Fluid:  >/= 2.3 L/day ? ? ? ? ?03/09/22, MS, RD, LDN ?Registered Dietitian II ?Inpatient Clinical Nutrition ?RD pager # and on-call/weekend pager # available in AMION  ? ?

## 2022-01-09 NOTE — Progress Notes (Signed)
Pt discharged to home in stable condition accompanied by niece and sister. Communicates understanding of discharge instructions via written communication, denies further questions or concerns at this time. Coolidge Breeze, RN 01/09/2022 ? ?

## 2022-01-09 NOTE — Progress Notes (Signed)
?  Transition of Care (TOC) Screening Note ? ? ?Patient Details  ?Name: Shelley [name] ?Date of Birth: 1961-07-27 ? ? ?Transition of Care [facility]) CM/SW Contact:    ?Dessa Phi, RN ?Phone Number: ?01/09/2022, 1:56 PM ? ? ? ?Transition of Care Department [facility]) has reviewed patient and no TOC needs have been identified at this time. We will continue to monitor patient advancement through interdisciplinary progression rounds. If new patient transition needs arise, please place a TOC consult. ?  ?

## 2022-01-09 NOTE — Discharge Instructions (Signed)
Low Fiber Nutrition Therapy  ? ?You may need a low-fiber diet if you have Crohn's disease, diverticulitis, gastroparesis, ulcerative colitis, a new colostomy, or new ileostomy. A low-fiber diet may also be needed following radiation therapy to the pelvis and lower bowel or recent intestinal surgery. ? ?A low-fiber diet reduces the frequency and volume of your stools. This lessens irritation to the gastrointestinal (GI) tract and can help you heal. Use this diet if you have a stricture so your intestine doesn't get blocked. The goal of this diet is to get less than 8 grams of fiber daily. It's also important to eat enough protein foods while you are on a low-fiber diet. ? ?Drink nutrition supplements that have 1 gram of fiber or less in each serving. If your stricture is severe or if your inflammation is severe, drink more liquids to reduce symptoms and to get enough calories and protein. ? ?Tips ?Eat about 5 to 6 small meals daily or about every 3 to 4 hours. Do not skip meals.  ?Every time you eat, include a small amount of protein (1 to 2 ounces) plus an additional food. Low fiber starch foods are the best choice to eat with protein.  ?Limit acidic, spicy and high-fat or fried and greasy foods to reduce GI symptoms.  ?Do not eat raw fruits and vegetables while on this diet. All fruits and vegetables need to be cooked and without peels or skins.  ?Drink a lot of fluids, at least 8 cups of fluid each day. Limit drinks with caffeine, sugar, and sugar substitutes.  ?Plain water is the best choice. Avoid mixing drink packets or flavor drops into water. .  ?Take a chewable multivitamin with minerals. Gummy vitamins do not have enough minerals and can block an ostomy and non-chewable supplements are not easily digested. Chewable supplements must be used if you have a stricture or ostomy.  ?If you are lactose intolerant, you may need to eat low-lactose dairy products. If you can't tolerate dairy, ask your RDN about how  you can get enough calcium from other foods.  ?Do not take a calcium supplement. They can cause a blockage.  ?It is important to add high-calcium foods gradually to your diet and monitor for symptoms to avoid a blockage.  ?Do not add more fiber to your diet until your health care provider or registered dietitian nutritionist (RDN) tells you it's OK. Fiber is part of whole grains, fruits and vegetables (foods from plants) and needs to be slowly added back in to your diet when your body is healed.  ?Choose foods that have been safely handled and prepared to lower your risk of foodborne illness. Talk to your RDN or see the Food Safety Nutrition Therapy handout for more information.  ? ?Foods Recommended ?These foods are low in fat and fiber and will help with your GI symptoms. ?Food Group Foods Recommended  ?Grains  Choose grain foods with less than 2 grams of fiber per serving. ?Refined white flour products--for example, enriched white bread without seeds, crackers or pasta ?Cream of wheat or rice ?Grits (fine ground) ?Tortillas: white flour or corn ?White rice, well-cooked (do not rinse, or soak before cooking) ?Cold and hot cereals made from white or refined flour such as puffed rice or corn flakes  ?Protein Foods  Lean, very tender, well-cooked poultry or fish; red meats: beef, pork or lamb (slow cook until soft; chop meats if you have stricture or ostomy) ?Eggs, well-cooked ?Smooth nut butters such as almond,   peanut, or sunflower ?Tofu  ?Dairy  If you have lactose intolerance, drinking milk products from cows or goats may make diarrhea worse. Foods marked with an asterisk (*) have lactose. ?Milk: fat-free, 1% or 2% * (choose best tolerated) ?Lactose-free milk ?Buttermilk* ?Fortified non-dairy milks: almond, cashew, coconut, or rice (be aware that these options are not good sources of protein so you will need to eat an additional protein food) ?Kefir* (Don't include kefir in the diet until approved by your health  care provider) ?Yogurt*/lactose-free yogurt (without nuts, fruit, granola or chocolate) ?Mild cheese* (hard and aged cheeses tend to be lower in lactose such as cheddar, swiss or parmesan) ?Cottage cheese* or lactose-free cottage cheese ?Low-fat ice cream* or lactose-free ice cream ?Sherbet* (usually lower lactose)  ?Vegetables  Canned and well-cooked vegetables without seeds, skins, or hulls  ?Carrots or green beans, cooked ?White, red or yellow potatoes without skins ?Strained vegetable juice  ?Fruit Soft, and well-cooked fruits without skins, seeds, or membranes ?Canned fruit in juice: peaches, pears, or applesauce ?Fruit juice without pulp diluted by half with water may be tolerated better ?Fruit drinks fortified with vitamin C may be tolerated better than 100% fruit juice  ?Oils  When possible, choose healthy oils and fats, such as olive and canola oils, plant oils rather than solid fats.  ?Other  Broth and strained soups made from allowed foods ?Desserts (small portions) without whole grains, seeds, nuts, raisins, or coconut ?Jelly (clear)  ? ?Foods Not Recommended ?These foods are higher in fat and fiber and may make your GI symptoms worse.  ?Food Group Foods Not Recommended  ?Grains  Bread, whole wheat or with whole grain flour or seeds or nuts ?Brown rice, quinoa, kasha, barley ?Tortillas: whole grain ?Whole wheat pasta ?Whole grain and high-fiber cereals, including oatmeal, bran flakes or shredded wheat ?Popcorn  ?Protein Foods  Steak, pork chops, or other meats that are fatty or have gristle ?Fried meat, poultry, or fish ?Seafood with a tough or rubbery texture, such as shrimp ?Luncheon meats such as bologna and salami ?Sausage, bacon, or hot dogs ?Dried beans, peas, or lentils ?Hummus ?Sushi ?Nuts and chunky nut butters  ?Dairy  Whole milk ?Pea milk and soymilk (may cause diarrhea, gas, bloating, and abdominal pain) ?Cream ?Half-and-half ?Sour cream ?Yogurt with added fruit, nuts, or granola or chocolate   ?Vegetables  Alfalfa or bean sprouts (high fiber and risk for bacteria) ?Raw or undercooked vegetables: beets; broccoli; brussels sprouts; cabbage; cauliflower; collard, mustard, or turnip greens; corn; cucumber; green peas or any kind of peas; kale; lima beans; mushrooms; okra; olives; pickles and relish; onions; parsnips; peppers; potato skins; sauerkraut; spinach; tomatoes  ?Fruit Raw fruit ?Dried fruit ?Avocado, berries, coconut ?Canned fruit in syrup ?Canned fruit with mandarin oranges, papaya or pineapple ?Fruit juice with pulp ?Prune juice ?Fruit skin  ?Oils  Pork rinds  ? ?Low-Fiber (8 grams) Sample 1-Day Menu  ?Breakfast ? cup cream of wheat (0.5 gram fiber)  ?1 slice white toast (1 gram fiber)  ?1 teaspoon margarine, soft tub  ?2 scrambled eggs   ?Morning Snack 1 cup lactose-free nutrition supplement  ?Lunch 2 slices white bread (2 grams fiber)  ?3 tablespoons tuna  ?1 tablespoon mayonnaise  ?1 cup chicken noodle soup (1 gram fiber)  ?? cup apple juice   ?Afternoon Snack 6 saltine crackers (0.5 gram fiber)  ?2 ounces low-fat cheddar cheese  ?Evening Meal 3 ounces tender chicken breast  ?1 cup white rice (0.5 gram fiber)  ?? cup   cooked canned green beans (2 grams fiber)  ?? cup cranberry juice   ?Evening Snack 1 cup lactose-free nutrition supplement  ? ?Copyright 2020 ? Academy of [employer] Dietetics ? ?High Fiber Nutrition Therapy  ?Fiber and fluid may help you feel less constipated and bloated and can also help ease diarrhea. Increase fiber slowly over the course of a few weeks. This will keep your symptoms from getting worse. ?Tips ?Tips for Adding Fiber to Your Eating Plan ?Slowly increase the amount of fiber you eat to 25 to 35 grams per day. ?Eat whole grain breads and cereals. Look for choices with 100% whole wheat, rye, oats, or bran as the first or second ingredient. ?Have brown or wild rice instead of white rice or potatoes. ?Enjoy a variety of grains. Good choices include barley, oats,  farro, kamut, and quinoa. ?Bake with whole wheat flour. You can use it to replace some white or all-purpose flour in recipes. ?Enjoy baked beans more often! Add dried beans and peas to casseroles or soups. ?

## 2022-01-09 NOTE — Discharge Summary (Addendum)
Physician Discharge Summary  ?Shelley [name] ZOX:[id number] DOB: [birth year] DOA: 01/06/2022 ? ?PCP: Leilani Able, MD ? ?Admit date: 01/06/2022 ?Discharge date: 01/09/2022 ?Recommendations for Outpatient Follow-up:  ?Follow up with PCP in 1 weeks-call for appointment ?Follow-up with gastroenterology Dr. Loreta Ave in 1 week ?Please obtain BMP/CBC in one week ? ?Discharge Dispo: home ?Discharge Condition: Stable ?Code Status:   Code Status: Full Code ?Diet recommendation:  ?Diet Order   ? ?       ?  DIET SOFT Room service appropriate? Yes; Fluid consistency: Thin  Diet effective now       ?  ?  Diet - low sodium heart healthy       ?  ? ?  ?  ? ?  ?  ?Brief/Interim Summary: ?61 y.o. female with seasonal allergies, deafness, OSA on CPAP, thyroid disease, overweight,significant alcohol abuse (which the patient denies), tobacco brought to the ED  due to multiple episodes of vomiting, abdominal pain and hematuria according to family members. ?As per ED her emesis was yellow in color.She has been vomiting often for over a year. She used to be morbidly obese and now her weight is down to 73.5 kg with a BMI of 26.15 kg/m?Marland Kitchen  She is able to communicate with sign language or in written form.  In the ED CT abdomen negative for acute finding, lipase 447 with macrocytosis, mildly elevated LFTs, hypokalemia. ?GI was consulted and admitted for further management  ?Patient improved with conservative management.  GI has signed off.  At this time she is tolerating diet and stable discharge home.  I discussed with Dr. Loreta Ave from gastroenterologist advised not to trend/check lipase since patient is improving and is okay for discharge home and she will arrange outpatient follow-up.  Patient is strongly encouraged on alcohol cessation. ?Discharge Diagnoses:  ?Principal Problem: ?  Acute pancreatitis ?Active Problems: ?  Thyromegaly ?  Sleep apnea ?  Prolonged QT interval ?  Deafness ?  Hypokalemia ?  Weight loss ?  Chronic vomiting ?  Alcohol abuse ?   Folate deficiency ?  Hypoglycemia ?  Malnutrition of moderate degree ? ?Acute pancreatitis, likely EtOH related: Patient is clinically improved although lipase this morning uptrending.  Discussed with Dr. Linda Hedges not advise trending lipase since patient is clinically stable improving has not needed pain medication.  Tolerating diet.  Encourage alcohol cessation.  Continue PPI.  Follow-up with GI as outpatient.   ?  ?Hypokalemia-resolved ?  ?Hypoglycemia: resolved.  Now tolerating diet ? ?Weight loss ?Chronic vomiting ?Blood-tinged vomitus: ?Chronic vomiting issues with weight loss.Has had blood-tinged vomit seen by GI-not planning for CT patient.Outpatient GI follow-up W/ Dr Loreta Ave.  At this time no nausea vomiting.  Tolerating diet.  Will discharge on oral PPI.  Her hemoglobin is stable at 13 g. ?  ?Alcohol abuse as per the family members but patient denies,monitor closely for withdrawal symptoms,ciwa ataivan, thiamine folate.  No evidence of alcohol withdrawal. ? ?Moderate malnutrition: augment nutrition ?Nutrition Status: ?Nutrition Problem: Moderate Malnutrition ?Etiology: social / environmental circumstances ?Signs/Symptoms: mild fat depletion, mild muscle depletion ?Interventions: Refer to RD note for recommendations ?  ?  ?Folate deficiency  ?B12 insufficiency: ?Started B12 and folic acid replacement.  Continue oral ?  ?Thyromegaly: TSH stable ?Sleep apnea-CPAP  trial. ?Prolonged QT interval: Monitor.  Repleted electrolytes.  EKG prior to discharge ?Deafness: Needing one-to-one communicative ? ?Consults: ?Gastroenterology ?Subjective: ?Alert and oriented, able to communicate with writing.  No nausea vomiting abdominal pain. ? ?Discharge Exam: ?Vitals:  ?  01/09/22 0326 01/09/22 1214  ?BP: 128/71 140/71  ?Pulse: 66 61  ?Resp: 18 16  ?Temp: 98.1 ?F (36.7 ?C) 98 ?F (36.7 ?C)  ?SpO2: 100% 99%  ? ?General: Pt is alert, awake, not in acute distress ?Cardiovascular: RRR, S1/S2 +, no rubs, no gallops ?Respiratory: CTA  bilaterally, no wheezing, no rhonchi ?Abdominal: Soft, NT, ND, bowel sounds + ?Extremities: no edema, no cyanosis ? ?Discharge Instructions ? ?Discharge Instructions   ? ? Diet - low sodium heart healthy   Complete by: As directed ?  ? Increase activity slowly   Complete by: As directed ?  ? ?  ? ?Allergies as of 01/09/2022   ? ?   Reactions  ? Penicillins Other (See Comments)  ? Caused deafness per pt  ? Tylenol [acetaminophen] Other (See Comments)  ? Causes shaking  ? ?  ? ?  ?Medication List  ?  ? ?TAKE these medications   ? ?folic acid 1 MG tablet ?Commonly known as: FOLVITE ?Take 1 tablet (1 mg total) by mouth daily. ?  ?multivitamin with minerals tablet ?Take 1 tablet by mouth daily. ?  ?ondansetron 4 MG tablet ?Commonly known as: Zofran ?Take 1 tablet (4 mg total) by mouth every 8 (eight) hours as needed for nausea or vomiting. ?  ?pantoprazole 40 MG tablet ?Commonly known as: Protonix ?Take 1 tablet (40 mg total) by mouth daily. ?  ?thiamine 100 MG tablet ?Take 1 tablet (100 mg total) by mouth daily. ?Start taking on: January 10, 2022 ?  ?vitamin B-12 500 MCG tablet ?Commonly known as: CYANOCOBALAMIN ?Take 1 tablet (500 mcg total) by mouth daily. ?  ? ?  ? ? ? Follow-up Information   ? ? Leilani Able, MD Follow up in 1 week(s).   ?Specialty: Family Medicine ?Contact information: ?[address] [facility] ?[place] [address] ?[phone] ? ? ?  ?  ? ? Charna Elizabeth, MD Follow up in 1 week(s).   ?Specialty: Gastroenterology ?Why: offie will call if not then call in 1 wk ?Contact information: ?[address] ?[place] [address] ?[phone] ? ? ?  ?  ? ?  ?  ? ?  ? ?Allergies  ?Allergen Reactions  ? Penicillins Other (See Comments)  ?  Caused deafness per pt  ? Tylenol [Acetaminophen] Other (See Comments)  ?  Causes shaking  ? ? ?The results of significant diagnostics from this hospitalization (including imaging, microbiology, ancillary and laboratory) are listed below for reference.    ? ?Microbiology: ?No results found for this or any previous visit (from the past 240 hour(s)).  ?Procedures/Studies: ?CT ABDOMEN PELVIS W CONTRAST ? ?Result Date: 01/06/2022 ?CLINICAL DATA:  Abdominal pain EXAM: CT ABDOMEN AND PELVIS WITH CONTRAST TECHNIQUE: Multidetector CT imaging of the abdomen and pelvis was performed using the standard protocol following bolus administration of intravenous contrast. RADIATION DOSE REDUCTION: This exam was performed according to the departmental dose-optimization program which includes automated exposure control, adjustment of the mA and/or kV according to patient size and/or use of iterative reconstruction technique. CONTRAST:  OMNIPAQUE IOHEXOL 300 MG/ML  SOLN COMPARISON:  None. FINDINGS: Lower Chest: Normal. Hepatobiliary: Mottled attenuation pattern of the liver. No focal lesion. Gallbladder normal. Pancreas: Normal pancreas. No ductal dilatation or peripancreatic fluid collection. Spleen: Normal. Adrenals/Urinary Tract: The adrenal glands are normal. No hydronephrosis, nephroureterolithiasis or solid renal mass. The urinary bladder is normal for degree of distention Stomach/Bowel: There is no hiatal hernia. Normal duodenal course and caliber. No small bowel  dilatation or inflammation. No focal colonic abnormality. Normal appendix. Vascular/Lymphatic: Normal course and caliber of the major abdominal vessels. No abdominal or pelvic lymphadenopathy. Reproductive: Normal uterus. No adnexal mass. Other: None. Musculoskeletal: No bony spinal canal stenosis or focal osseous abnormality. IMPRESSION: 1. No acute abnormality of the abdomen or pelvis. 2. Model attenuation pattern of the liver, likely geographic fatty infiltration. Electronically Signed   By: Deatra RobinsonKevin  Herman M.D.   On: 01/06/2022 21:07   ? ?Labs: ?BNP (last 3 results) ?No results for input(s): BNP in the last 8760 hours. ?Basic Metabolic Panel: ?Recent Labs  ?Lab 01/06/22 ?1758 01/07/22 ?0753 01/08/22 ?0543  01/08/22 ?1503 01/09/22 ?0403  ?NA 137 138 137  --  135  ?K 2.7* 2.8* 2.9* 4.4 3.7  ?CL 88* 97* 101  --  104  ?CO2 29 27 26   --  25  ?GLUCOSE 94 94 72  --  129*  ?BUN 7 7 <5*  --  <5*  ?CREATININE 0.68 0.72 0.55  --  0.47  ?CA

## 2022-05-12 NOTE — Progress Notes (Signed)
 Subjective Shelley [name] is a 61 y.o. female with hx deafness accompanied by family member who presents with request for continued workup for weight loss.  ASL interpreter was utilized for duration of visit.   She was seen in ED April of this year, dx pancreatitis secondary to alcohol use. She has since stopped drinking alcohol and has started eating more (folate deficiency at time of previous admission). Per chart review, she has been able to put back on a bit of weight since that visit (at least 16lb), but still not up to what she reports was her prior weight over 200lb.  She has continued pelvic/lower abd pain, which has been present for months and overall is improved but waxing and waning. Hurts more with attempts to pass bowel movements and she admits likely has some constipation. It is not associated with fevers, dysuria, hematuria, vaginal discharge.  Family member also mentions chronic issues with balance, seems to be getting worse over time. Patient tells me she feels it is related to her deafness, as well as to feeling like her body is taken over by someone or something else at times. Family notes she has seen a counselor in the past with these concerns, but not currently. She denies headaches, acute vision loss, confusion, facial or extremity weakness, or significant fall. She does not describe visual hallucinations, but does sometimes feel like someone is talking to her inside her head/body.  She has appt with GI next week and PCP in 1 month.    Reviewed portions of the patient's history include: PMH, medications, allergies, social history   History reviewed. No pertinent past medical history.  No current outpatient medications on file prior to visit.   No current facility-administered medications on file prior to visit.    There is no problem list on file for this patient.    Objective Vitals:   05/12/22 1239  BP: 145/82  Pulse: 56  Temp: 97.9 F (36.6 C)  Resp: 16   SpO2: 98%  Height: 1.727 m (5' 8)  Weight: 83.6 kg (184 lb 6.4 oz)  BMI (Calculated): 28.1   General: VS are reviewed and are noted as above. WD, WN, appears well, INAD. Ears appear normal. Nares patent. MMM.  Sclera anicteric. Arcus senilis noted. PERRL.  Neck supple no LAD no meningismus Normal respiratory effort and excursion. Lungs CTAB Heart RRR. Abd soft, mild tenderness across the entire lower abdomen/suprapubic region without guarding or rebound. Normal bowel sounds. No CVAT Skin warm and dry without acute rash.  Alert and oriented. Normal coordination as it relates to signing throughout visit. Gait steady without ataxia or foot drop.   Assessment/Plan  1. History of abnormal weight loss  Hepatitis C Antibody    2. Lower abdominal pain  XR ACUTE ABDOMEN (SUPINE,UPRIGHT,AND 1 VIEW CHEST)   CBC and Differential    3. History of hypokalemia  Comprehensive Metabolic Panel    4. History of pancreatitis  Lipase    5. Loss of balance       We discussed most of the recommended workup for unintentional weight loss was previously completed, results from hospital stay reviewed. Suspect her weight loss was poor PO intake secondary to alcoholism, but will add Hep C and chest film (included in abd series). Overall, she seems quite improved from ED visit.  We will also recheck bloodwork that was abnormal during her hospital stay Discussed with patient potential future workup- for pelvic/lower abd pain may require colonoscopy, pelvic US . Less likely repeat  CT as pain was present at that time as well. Re: balance issues consider CT but MRI would be more helpful and would typically be deferred to PCP in the absence of emergent symptoms. They prefer to wait to talk to PCP at upcoming appt.    Patient Instructions  We will call with test results when all of them return, sooner if any are concerning.  Follow-up next with with GI as planned and next month with Natalie as scheduled Sooner  recheck if fevers, severe pain, vomiting more than 1 episode, severe headaches, vision loss, or other acute concerning symptoms.       Electronically signed by: Corean Thayne Geralds, PA-C 05/12/22 1911

## 2024-05-06 ENCOUNTER — Emergency Department (HOSPITAL_COMMUNITY)

## 2024-05-06 ENCOUNTER — Other Ambulatory Visit: Payer: Self-pay

## 2024-05-06 ENCOUNTER — Encounter (HOSPITAL_COMMUNITY): Payer: Self-pay | Admitting: *Deleted

## 2024-05-06 ENCOUNTER — Emergency Department (HOSPITAL_COMMUNITY)
Admission: EM | Admit: 2024-05-06 | Discharge: 2024-05-06 | Disposition: A | Discharge status: Home / Self Care | Attending: Emergency Medicine | Admitting: Emergency Medicine

## 2024-05-06 DIAGNOSIS — R1030 Lower abdominal pain, unspecified: Secondary | ICD-10-CM | POA: Diagnosis present

## 2024-05-06 DIAGNOSIS — R432 Parageusia: Secondary | ICD-10-CM | POA: Insufficient documentation

## 2024-05-06 DIAGNOSIS — F17200 Nicotine dependence, unspecified, uncomplicated: Secondary | ICD-10-CM | POA: Diagnosis not present

## 2024-05-06 DIAGNOSIS — Z8639 Personal history of other endocrine, nutritional and metabolic disease: Secondary | ICD-10-CM | POA: Insufficient documentation

## 2024-05-06 LAB — URINALYSIS, W/ REFLEX TO CULTURE (INFECTION SUSPECTED)
Bacteria, UA: NONE SEEN
Bilirubin Urine: NEGATIVE
Glucose, UA: NEGATIVE mg/dL
Ketones, ur: 5 mg/dL — AB
Nitrite: NEGATIVE
Protein, ur: 30 mg/dL — AB
Specific Gravity, Urine: 1.009 (ref 1.005–1.030)
pH: 6 (ref 5.0–8.0)

## 2024-05-06 LAB — RESP PANEL BY RT-PCR (RSV, FLU A&B, COVID)  RVPGX2
Influenza A by PCR: NEGATIVE
Influenza B by PCR: NEGATIVE
Resp Syncytial Virus by PCR: NEGATIVE
SARS Coronavirus 2 by RT PCR: NEGATIVE

## 2024-05-06 LAB — CBC WITH DIFFERENTIAL/PLATELET
Abs Immature Granulocytes: 0.01 K/uL (ref 0.00–0.07)
Basophils Absolute: 0 K/uL (ref 0.0–0.1)
Basophils Relative: 1 %
Eosinophils Absolute: 0 K/uL (ref 0.0–0.5)
Eosinophils Relative: 0 %
HCT: 51.9 % — ABNORMAL HIGH (ref 36.0–46.0)
Hemoglobin: 17 g/dL — ABNORMAL HIGH (ref 12.0–15.0)
Immature Granulocytes: 0 %
Lymphocytes Relative: 35 %
Lymphs Abs: 1.2 K/uL (ref 0.7–4.0)
MCH: 30.6 pg (ref 26.0–34.0)
MCHC: 32.8 g/dL (ref 30.0–36.0)
MCV: 93.5 fL (ref 80.0–100.0)
Monocytes Absolute: 0.3 K/uL (ref 0.1–1.0)
Monocytes Relative: 9 %
Neutro Abs: 1.9 K/uL (ref 1.7–7.7)
Neutrophils Relative %: 55 %
Platelets: 211 K/uL (ref 150–400)
RBC: 5.55 MIL/uL — ABNORMAL HIGH (ref 3.87–5.11)
RDW: 14.6 % (ref 11.5–15.5)
WBC: 3.4 K/uL — ABNORMAL LOW (ref 4.0–10.5)
nRBC: 0 % (ref 0.0–0.2)

## 2024-05-06 LAB — COMPREHENSIVE METABOLIC PANEL WITH GFR
ALT: 18 U/L (ref 0–44)
AST: 25 U/L (ref 15–41)
Albumin: 3.3 g/dL — ABNORMAL LOW (ref 3.5–5.0)
Alkaline Phosphatase: 60 U/L (ref 38–126)
Anion gap: 14 (ref 5–15)
BUN: <5 mg/dL — ABNORMAL LOW (ref 8–23)
CO2: 31 mmol/L (ref 22–32)
Calcium: 9 mg/dL (ref 8.9–10.3)
Chloride: 93 mmol/L — ABNORMAL LOW (ref 98–111)
Creatinine, Ser: 0.61 mg/dL (ref 0.44–1.00)
GFR, Estimated: >60 mL/min (ref >60)
Glucose, Bld: 101 mg/dL — ABNORMAL HIGH (ref 70–99)
Potassium: 2.5 mmol/L — CL (ref 3.5–5.1)
Sodium: 138 mmol/L (ref 135–145)
Total Bilirubin: 0.9 mg/dL (ref 0.0–1.2)
Total Protein: 6.6 g/dL (ref 6.5–8.1)

## 2024-05-06 LAB — LIPASE, BLOOD: Lipase: 46 U/L (ref 11–51)

## 2024-05-06 LAB — ETHANOL: Alcohol, Ethyl (B): <15 mg/dL (ref <15)

## 2024-05-06 MED ORDER — POTASSIUM CHLORIDE 10 MEQ/100ML IV SOLN
10.0000 meq | Freq: Once | INTRAVENOUS | Status: AC
  Administered 2024-05-06: 10 meq via INTRAVENOUS

## 2024-05-06 MED ORDER — POTASSIUM CHLORIDE 10 MEQ/100ML IV SOLN
10.0000 meq | INTRAVENOUS | Status: AC
  Administered 2024-05-06 (×2): 10 meq via INTRAVENOUS
  Filled 2024-05-06 (×3): qty 100

## 2024-05-06 MED ORDER — IOHEXOL 300 MG/ML  SOLN
100.0000 mL | Freq: Once | INTRAMUSCULAR | Status: AC | PRN
  Administered 2024-05-06: 100 mL via INTRAVENOUS

## 2024-05-06 MED ORDER — POTASSIUM CHLORIDE CRYS ER 20 MEQ PO TBCR: 40.0000 meq | EXTENDED_RELEASE_TABLET | Freq: Every day | ORAL | 0 refills | Status: DC

## 2024-05-06 NOTE — ED Provider Notes (Signed)
 Emergency Department Provider Note   I have reviewed the triage vital signs and the nursing notes.   HISTORY  Chief Complaint Abdominal Pain  Video ALS interpreter Sheliah) was used for this encounter.   HPI Shelley [name] is a 63 y.o. female with past history reviewed below including pancreatitis and alcohol use presents to the emergency department for evaluation of loss of taste/smell, lower abdominal discomfort, nausea.  She continues to drink 1-2 shots of alcohol at night to help her sleep.  She has had pancreatitis in the past.  She has had some nausea but no vomiting.  No blood in the stools.  No dysuria, hesitancy, urgency.  She has baseline urine frequency which is unchanged.  She has also had COVID in the past and lost her taste/smell with that in the past. No URI symptoms otherwise.    Past Medical History:  Diagnosis Date   Allergy    Deaf    OSA on CPAP 10/03/2011   Thyroid  disease     Review of Systems  Constitutional: No fever/chills Cardiovascular: Denies chest pain. Respiratory: Denies shortness of breath. Gastrointestinal: Positive abdominal pain. Positive nausea, no vomiting.   Skin: Negative for rash. Neurological: Negative for headaches.   ____________________________________________   PHYSICAL EXAM:  VITAL SIGNS: ED Triage Vitals  Encounter Vitals Group     BP 05/06/24 0927 (!) 175/99     Pulse Rate 05/06/24 0927 71     Resp 05/06/24 0927 18     Temp 05/06/24 0927 98.4 F (36.9 C)     Temp Source 05/06/24 0927 Oral     SpO2 05/06/24 0927 99 %     Weight 05/06/24 0930 162 lb (73.5 kg)   Constitutional: Alert and oriented. Well appearing and in no acute distress. Eyes: Conjunctivae are normal.  Head: Atraumatic. Nose: No congestion/rhinnorhea. Mouth/Throat: Mucous membranes are moist.   Neck: No stridor.   Cardiovascular: Normal rate, regular rhythm. Good peripheral circulation. Grossly normal heart sounds.   Respiratory: Normal  respiratory effort.  No retractions. Lungs CTAB. Gastrointestinal: Soft with mild lower abdominal tenderness. No distention.  Musculoskeletal: No lower extremity tenderness nor edema. No gross deformities of extremities. Neurologic:  No gross focal neurologic deficits are appreciated.  Skin:  Skin is warm, dry and intact. No rash noted.   ____________________________________________   LABS (all labs ordered are listed, but only abnormal results are displayed)  Labs Reviewed  RESP PANEL BY RT-PCR (RSV, FLU A&B, COVID)  [id number]  COMPREHENSIVE METABOLIC PANEL WITH GFR  LIPASE, BLOOD  CBC WITH DIFFERENTIAL/PLATELET  URINALYSIS, W/ REFLEX TO CULTURE (INFECTION SUSPECTED)  ETHANOL   ____________________________________________  RADIOLOGY  No results found.  ____________________________________________   PROCEDURES  Procedure(s) performed:   Procedures  None  ____________________________________________   INITIAL IMPRESSION / ASSESSMENT AND PLAN / ED COURSE  Pertinent labs & imaging results that were available during my care of the patient were reviewed by me and considered in my medical decision making (see chart for details).   This patient is Presenting for Evaluation of abdominal pain, which does require a range of treatment options, and is a complaint that involves a high risk of morbidity and mortality.  The Differential Diagnoses includes but is not exclusive to acute cholecystitis, intrathoracic causes for epigastric abdominal pain, gastritis, duodenitis, pancreatitis, small bowel or large bowel obstruction, abdominal aortic aneurysm, hernia, gastritis, etc.   Critical Interventions-    Medications - No data to display  Reassessment after intervention:  I did obtain Additional Historical Information from family at bedside.  I decided to review pertinent External Data, and in summary patient with hospitalization for pancreatitis in 2023.   Clinical  Laboratory Tests Ordered, included ***  Radiologic Tests Ordered, included CT abdomen/pelvis. I independently interpreted the images and agree with radiology interpretation.   Cardiac Monitor Tracing which shows NSR.    Social Determinants of Health Risk patient is a smoker and drinks EtOH at night before bed.   Consult complete with  Medical Decision Making: Summary:  Patient presents emergency department for evaluation of lower abdominal pain and loss of taste and smell.  Will obtain COVID PCR testing.  No other URI symptoms.  Plan for CT abdomen pelvis with history of pancreatitis in the past.  Labs been sent.  Reevaluation with update and discussion with   ***Considered admission***  Patient's presentation is most consistent with acute presentation with potential threat to life or bodily function.   Disposition:   ____________________________________________  FINAL CLINICAL IMPRESSION(S) / ED DIAGNOSES  Final diagnoses:  None     NEW OUTPATIENT MEDICATIONS STARTED DURING THIS VISIT:  New Prescriptions   No medications on file    Note:  This document was prepared using Dragon voice recognition software and may include unintentional dictation errors.  Fonda Law, MD, [facility] Emergency [facility]

## 2024-05-06 NOTE — ED Triage Notes (Signed)
 Deaf: BIB family from home for abd pain, nausea and lost of taste and smell. Feels similar to episode 3 years ago. 3 weeks ago BM was like jelly. Last BM today (normal). Alert, NAD, calm, interactive, resps e/u, steady gait. Family at [facility] signs ASL.   ASL AM[employer] video interpreter used Roman [phone]

## 2024-05-06 NOTE — Discharge Instructions (Signed)
 Your potassium was low today which could be contributing to some of your symptoms.  We are replacing it with IV but you will need to be on potassium pills for the next 5 days.  Please have your primary care doctor repeat your potassium labs in the coming week.  Return with any new recently worsening symptoms.

## 2024-05-12 ENCOUNTER — Other Ambulatory Visit: Payer: Self-pay

## 2024-05-12 ENCOUNTER — Encounter (HOSPITAL_BASED_OUTPATIENT_CLINIC_OR_DEPARTMENT_OTHER): Payer: Self-pay

## 2024-05-12 ENCOUNTER — Emergency Department (HOSPITAL_BASED_OUTPATIENT_CLINIC_OR_DEPARTMENT_OTHER)
Admission: EM | Admit: 2024-05-12 | Discharge: 2024-05-13 | Disposition: A | Discharge status: Home / Self Care | Attending: Emergency Medicine | Admitting: Emergency Medicine

## 2024-05-12 DIAGNOSIS — R3129 Other microscopic hematuria: Secondary | ICD-10-CM

## 2024-05-12 DIAGNOSIS — R1013 Epigastric pain: Secondary | ICD-10-CM | POA: Diagnosis not present

## 2024-05-12 DIAGNOSIS — F17210 Nicotine dependence, cigarettes, uncomplicated: Secondary | ICD-10-CM | POA: Diagnosis not present

## 2024-05-12 DIAGNOSIS — R112 Nausea with vomiting, unspecified: Secondary | ICD-10-CM | POA: Diagnosis present

## 2024-05-12 LAB — URINALYSIS, ROUTINE W REFLEX MICROSCOPIC
Bacteria, UA: NONE SEEN
Glucose, UA: NEGATIVE mg/dL
Ketones, ur: 40 mg/dL — AB
Nitrite: NEGATIVE
Specific Gravity, Urine: 1.024 (ref 1.005–1.030)
pH: 5 (ref 5.0–8.0)

## 2024-05-12 LAB — COMPREHENSIVE METABOLIC PANEL WITH GFR
ALT: 14 U/L (ref 0–44)
AST: 27 U/L (ref 15–41)
Albumin: 3.8 g/dL (ref 3.5–5.0)
Alkaline Phosphatase: 80 U/L (ref 38–126)
Anion gap: 18 — ABNORMAL HIGH (ref 5–15)
BUN: 6 mg/dL — ABNORMAL LOW (ref 8–23)
CO2: 18 mmol/L — ABNORMAL LOW (ref 22–32)
Calcium: 9.8 mg/dL (ref 8.9–10.3)
Chloride: 96 mmol/L — ABNORMAL LOW (ref 98–111)
Creatinine, Ser: 0.79 mg/dL (ref 0.44–1.00)
GFR, Estimated: >60 mL/min (ref >60)
Glucose, Bld: 94 mg/dL (ref 70–99)
Potassium: 5.1 mmol/L (ref 3.5–5.1)
Sodium: 132 mmol/L — ABNORMAL LOW (ref 135–145)
Total Bilirubin: 0.6 mg/dL (ref 0.0–1.2)
Total Protein: 7 g/dL (ref 6.5–8.1)

## 2024-05-12 LAB — CBC
HCT: 54.4 % — ABNORMAL HIGH (ref 36.0–46.0)
Hemoglobin: 18.5 g/dL — ABNORMAL HIGH (ref 12.0–15.0)
MCH: 31.3 pg (ref 26.0–34.0)
MCHC: 34 g/dL (ref 30.0–36.0)
MCV: 92 fL (ref 80.0–100.0)
Platelets: 238 K/uL (ref 150–400)
RBC: 5.91 MIL/uL — ABNORMAL HIGH (ref 3.87–5.11)
RDW: 14.9 % (ref 11.5–15.5)
WBC: 4.7 K/uL (ref 4.0–10.5)
nRBC: 0 % (ref 0.0–0.2)

## 2024-05-12 LAB — LIPASE, BLOOD: Lipase: 40 U/L (ref 11–51)

## 2024-05-12 MED ORDER — LIDOCAINE VISCOUS HCL 2 % MT SOLN
15.0000 mL | Freq: Once | OROMUCOSAL | Status: AC
  Administered 2024-05-12 (×2): 15 mL via ORAL
  Filled 2024-05-12: qty 15

## 2024-05-12 MED ORDER — SUCRALFATE 1 G PO TABS: 1.0000 g | ORAL_TABLET | Freq: Three times a day (TID) | ORAL | 0 refills | Status: DC

## 2024-05-12 MED ORDER — METOCLOPRAMIDE HCL 10 MG PO TABS
10.0000 mg | ORAL_TABLET | Freq: Three times a day (TID) | ORAL | 0 refills | Status: DC | PRN
Start: 2024-05-12 — End: 2024-05-31

## 2024-05-12 MED ORDER — PANTOPRAZOLE SODIUM 20 MG PO TBEC: 20.0000 mg | DELAYED_RELEASE_TABLET | Freq: Every day | ORAL | 2 refills | Status: AC

## 2024-05-12 MED ORDER — ALUM & MAG HYDROXIDE-SIMETH 200-200-20 MG/5ML PO SUSP
30.0000 mL | Freq: Once | ORAL | Status: AC
  Administered 2024-05-12 (×2): 30 mL via ORAL
  Filled 2024-05-12: qty 30

## 2024-05-12 MED ORDER — SODIUM CHLORIDE 0.9 % IV BOLUS
1000.0000 mL | Freq: Once | INTRAVENOUS | Status: AC
  Administered 2024-05-12 (×2): 1000 mL via INTRAVENOUS

## 2024-05-12 MED ORDER — FAMOTIDINE IN NACL 20-0.9 MG/50ML-% IV SOLN
20.0000 mg | Freq: Once | INTRAVENOUS | Status: AC
  Administered 2024-05-12 (×2): 20 mg via INTRAVENOUS
  Filled 2024-05-12: qty 50

## 2024-05-12 NOTE — ED Notes (Signed)
 Gave a cup of water. No vomiting

## 2024-05-12 NOTE — ED Notes (Signed)
 Gave a pack of crackers. No vomiting

## 2024-05-12 NOTE — ED Triage Notes (Signed)
 Complaining of nausea and vomiting that started last Tuesday. She was seen at [facility] long and was given fluids and medication to take at home. Has not been getting better but worse.

## 2024-05-12 NOTE — Discharge Instructions (Addendum)
 ### Alcoholic Gastritis Overview     Alcoholic gastritis is inflammation of the stomach lining caused or worsened by drinking alcohol. This condition can lead to symptoms such as stomach pain, nausea, vomiting, and sometimes bleeding. Alcohol irritates the stomach lining, increases acid production, and slows down the healing process. Chronic alcohol use can also make it easier for harmful bacteria like *Helicobacter pylori* to grow in the stomach, which can further damage the lining and cause more severe symptoms.[1][2][3]      **Vomiting and Alcohol Use**      Vomiting is a common symptom in people with alcoholic gastritis and those who drink heavily. Alcohol can slow down how quickly the stomach empties, increase acid, and make the stomach more sensitive, all of which can lead to nausea and vomiting. If vomiting is severe or contains blood, it is important to seek medical attention.[2]      **Effects of Alcohol on Sleep**      Alcohol disrupts normal sleep patterns. While it may help people fall asleep faster, it reduces the quality of sleep, causes frequent awakenings, and can lead to insomnia. Over time, alcohol use can make sleep problems worse, especially during withdrawal, when sleep disturbance is a common symptom.[4][5]      **Alcohol Dependence and Withdrawal Symptoms**      Regular heavy drinking can lead to alcohol dependence, meaning the body gets used to alcohol and needs it to function normally. When someone suddenly stops drinking, withdrawal symptoms can occur. These include tremors, sweating, anxiety, nausea, vomiting, trouble sleeping, and, in severe cases, seizures or confusion (delirium tremens). Withdrawal symptoms usually start within 6-24 hours after the last drink and can last for several days. Medical supervision is often needed, especially for severe symptoms.[4][6][5][7]      **Medications Used in Treatment**      - **Pantoprazole  (Protonix ):** This medicine reduces  stomach acid, helping the stomach lining heal and decreasing pain and irritation. It is commonly used for gastritis and acid-related stomach problems.      - **Metoclopramide  (Reglan ):** This medication helps with nausea and vomiting by speeding up stomach emptying and blocking signals that cause nausea.      - **Sucralfate  (Carafate ):** Sucralfate  coats the stomach lining, protecting it from acid and helping it heal. It is often used for gastritis and ulcers.      These medications can help manage symptoms but do not treat alcohol dependence itself. They should be used as directed by a healthcare provider.      **Dietary and Lifestyle Modifications**      - **Stop drinking alcohol:** This is the most important step. Continued alcohol use will keep damaging the stomach and increase the risk of serious complications.[8][9][2][10][7][11]      - **Stop smoking:** Smoking also irritates the stomach and increases the risk of stomach problems. Quitting both alcohol and tobacco leads to better healing and overall health.[10]      - **Eat a balanced diet:** Focus on foods that are gentle on the stomach, such as lean proteins, cooked vegetables, and whole grains. Avoid spicy, acidic, or fatty foods that can worsen symptoms.      - **Stay hydrated:** Drink plenty of water, especially if vomiting is present.      - **Seek support:** Counseling, support groups, and medications for alcohol dependence may be recommended to help maintain abstinence and prevent relapse.[8][9][7][11]      **When to Seek Medical Help**      If symptoms are severe, do not improve,  or if there is vomiting of blood, black stools, confusion, or seizures, seek medical attention immediately.      **Summary**      Alcoholic gastritis and vomiting are serious health concerns linked to heavy drinking. Alcohol also disrupts sleep and can lead to dependence and withdrawal symptoms. Medications like pantoprazole , metoclopramide , and  sucralfate  can help manage stomach symptoms, but stopping alcohol and tobacco use is essential for recovery. Support from healthcare providers and lifestyle changes are key to long-term health.[1][4][8][9][2][6][5][10][7][3][11]      ### References  1. Gastritis in the Alcoholic: Relationship to Gastric Alcohol Metabolism and Helicobacter Pylori. Lieber CS. Addiction Biology. 1998;3(4):423-33. doi:10.1080/13556219871967. 2. The Effects of Alcohol Consumption Upon the Gastrointestinal Tract. Bujanda L. The American Journal of Gastroenterology. 2000;95(12):3374-82. doi:10.1111/j.1572-0241.2000.03347.x. 3. Gastric Ethanol Metabolism and Gastritis: Interactions With Other Drugs, Helicobacter Pylori, and Antibiotic Therapy (1957-1997)--a Review. Lieber CS. Alcoholism, Clinical and Experimental Research. 1997;21(8):1360-6. doi:10.1111/j.1530-0277.1997.ua95536.k. 4. Alcohol Withdrawal Syndrome: Outpatient Management. Tiglao SM, Meisenheimer ES, Oh RC. American Family Physician. 2021;104(3):253-262. 5. Will This Hospitalized Patient Develop Severe Alcohol Withdrawal Syndrome?The Rational Clinical Examination Systematic Review. Debarah BRAVO, Albarqouni L, Tkachuk S, et al. JAMA. 2018;320(8):825-833. doi:10.1001/jama.7981.89425. 6. Outpatient Management of Alcohol Withdrawal Syndrome. Muncie HL, Yasinian Y, Oge' L. American Family Physician. 2013;88(9):589-95. 7. Identification and Treatment of Alcohol Use Disorder. Haber PS. The [place] Journal of Medicine. 2025;392(3):258-266. doi:10.1056/NEJMra2306511. 8. The Effects of Alcohol on Gastrointestinal Tract, Liver and Pancreas: Evidence-Based Suggestions for Clinical Management. Federico A, Cotticelli G, Festi D, et al. European Review for Medical and [employer]. 2015;19(10):1922-40. 9. Management of Alcohol Use Disorder: A Gastroenterology and Hepatology-Focused Perspective. Daz LA, Knig D, Weber S, et al. [employer]. Gastroenterology & Hepatology.  2025;10(5):475-490. doi:10.1016/S2468-1253(24)00380-7. 10. Communication of Alcohol and Smoking Lifestyle Advice to the Gastroenterological Patient. Spence AD, Lovell CHRISTELLA Dawn PB, [facility] Best Practice & Research. Clinical Gastroenterology. 2017;31(5):597-604. doi:10.1016/j.bpg.2017.09.014. 11. Diagnosis and Pharmacotherapy of Alcohol Use Disorder: A Review. Kranzler HR, Emory CHRISTELLA. JAMA. 2018;320(8):815-824. doi:10.1001/jama.7981.88593.

## 2024-05-12 NOTE — ED Provider Notes (Signed)
 [place] EMERGENCY DEPARTMENT AT [facility] Provider Note   CSN: [id number] Arrival date & time: 05/12/24  1429     Patient presents with: Abdominal Pain   Shelley [name] is a 63 y.o. female.   The history is limited by a language barrier. A language interpreter was used.  Abdominal Pain Pain location:  Epigastric Pain quality: aching, bloating and burning   Pain radiates to:  Does not radiate Pain severity:  Moderate Onset quality:  Gradual Duration:  1 week Timing:  Constant Progression:  Unchanged Chronicity:  New Context: alcohol use   Relieved by:  Nothing Worsened by:  Nothing Associated symptoms: nausea and vomiting   Risk factors: alcohol abuse        Prior to Admission medications   Medication Sig Start Date End Date Taking? Authorizing Provider  metoCLOPramide  (REGLAN ) 10 MG tablet Take 1 tablet (10 mg total) by mouth every 8 (eight) hours as needed for nausea or vomiting. 05/12/24  Yes Iriana Artley, PA-C  pantoprazole  (PROTONIX ) 20 MG tablet Take 1 tablet (20 mg total) by mouth daily. 05/12/24  Yes Nikitia Asbill, PA-C  sucralfate  (CARAFATE ) 1 g tablet Take 1 tablet (1 g total) by mouth 4 (four) times daily -  with meals and at bedtime. 05/12/24  Yes Jennene Downie, PA-C  ondansetron  (ZOFRAN ) 4 MG tablet Take 1 tablet (4 mg total) by mouth every 8 (eight) hours as needed for nausea or vomiting. 01/09/22   Christobal Guadalajara, MD  pantoprazole  (PROTONIX ) 40 MG tablet Take 1 tablet (40 mg total) by mouth daily. 01/09/22 02/08/22  Christobal Guadalajara, MD  potassium chloride  SA (KLOR-CON  M) 20 MEQ tablet Take 2 tablets (40 mEq total) by mouth daily for 5 days. 05/06/24 05/11/24  Darra Fonda MATSU, MD    Allergies: Penicillins and Tylenol [acetaminophen]    Review of Systems  Gastrointestinal:  Positive for abdominal pain, nausea and vomiting.    Updated Vital Signs BP (!) 143/83   Pulse (!) 55   Temp 98.9 F (37.2 C)   Resp 18   Ht 5' 6 (1.676 m)   Wt 72.6 kg    SpO2 99%   BMI 25.82 kg/m   Physical Exam Vitals and nursing note reviewed.  Constitutional:      General: She is not in acute distress.    Appearance: She is well-developed. She is not diaphoretic.  HENT:     Head: Normocephalic and atraumatic.     Right Ear: External ear normal.     Left Ear: External ear normal.     Nose: Nose normal.     Mouth/Throat:     Mouth: Mucous membranes are moist.  Eyes:     General: No scleral icterus.    Conjunctiva/sclera: Conjunctivae normal.  Cardiovascular:     Rate and Rhythm: Normal rate and regular rhythm.     Heart sounds: Normal heart sounds. No murmur heard.    No friction rub. No gallop.  Pulmonary:     Effort: Pulmonary effort is normal. No respiratory distress.     Breath sounds: Normal breath sounds.  Abdominal:     General: Bowel sounds are normal. There is no distension.     Palpations: Abdomen is soft. There is no mass.     Tenderness: There is abdominal tenderness in the epigastric area. There is no guarding.  Musculoskeletal:     Cervical back: Normal range of motion.  Skin:    General: Skin is warm and dry.  Neurological:  Mental Status: She is alert and oriented to person, place, and time.  Psychiatric:        Behavior: Behavior normal.     (all labs ordered are listed, but only abnormal results are displayed) Labs Reviewed  URINE CULTURE - Abnormal; Notable for the following components:      Result Value   Culture MULTIPLE SPECIES PRESENT, SUGGEST RECOLLECTION (*)    All other components within normal limits  COMPREHENSIVE METABOLIC PANEL WITH GFR - Abnormal; Notable for the following components:   Sodium 132 (*)    Chloride 96 (*)    CO2 18 (*)    BUN 6 (*)    Anion gap 18 (*)    All other components within normal limits  CBC - Abnormal; Notable for the following components:   RBC 5.91 (*)    Hemoglobin 18.5 (*)    HCT 54.4 (*)    All other components within normal limits  URINALYSIS, ROUTINE W REFLEX  MICROSCOPIC - Abnormal; Notable for the following components:   Hgb urine dipstick LARGE (*)    Bilirubin Urine SMALL (*)    Ketones, ur 40 (*)    Protein, ur TRACE (*)    Leukocytes,Ua TRACE (*)    All other components within normal limits  LIPASE, BLOOD    EKG: None  Radiology: No results found.   Procedures   Medications Ordered in the ED  sodium chloride  0.9 % bolus 1,000 mL (0 mLs Intravenous Stopped 05/12/24 2309)  famotidine  (PEPCID ) IVPB 20 mg premix (0 mg Intravenous Stopped 05/12/24 2239)  alum & mag hydroxide-simeth (MAALOX/MYLANTA) 200-200-20 MG/5ML suspension 30 mL (30 mLs Oral Given 05/12/24 2209)    And  lidocaine  (XYLOCAINE ) 2 % viscous mouth solution 15 mL (15 mLs Oral Given 05/12/24 2209)                                    Medical Decision Making Amount and/or Complexity of Data Reviewed Labs: ordered.  Risk OTC drugs. Prescription drug management.   This patient presents to the ED for concern of abdominal pain nausea and vomiting, this involves an extensive number of treatment options, and is a complaint that carries with it a high risk of complications and morbidity.  The emergent differential diagnosis for vomiting includes, but is not limited to ACS/MI, DKA, Ischemic bowel, Meningitis, Sepsis, Acute gastric dilation, Adrenal insufficiency, Appendicitis,  Bowel obstruction/ileus, Carbon monoxide poisoning, Cholecystitis, Electrolyte abnormalities, Elevated ICP, Gastric outlet obstruction, Pancreatitis, Ruptured viscus, Biliary colic, Cannabinoid hyperemesis syndrome, Gastritis, Gastroenteritis, Gastroparesis,  Narcotic withdrawal, Peptic ulcer disease, and UTI   Co morbidities:   has a past medical history of Allergy, Deaf, OSA on CPAP (10/03/2011), and Thyroid  disease. Chronic alcohol abuse and tobacco abuse  Social Determinants of Health:   SDOH Screenings   Tobacco Use: High Risk (05/12/2024)     Additional history:  {Additional history  obtained from patient's daughter at bedside {External records from outside source obtained and reviewed including previous ED evaluations  Lab Tests:  I Ordered, and personally interpreted labs.  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 Lipase within normal limits UA equivocal-microscopic hematuria, sent for culture follow-up with PCP about urine  Cardiac Monitoring/ECG:  The patient was maintained  on a cardiac monitor.  I personally viewed and interpreted the cardiac monitored which showed an underlying rhythm of:   Medicines ordered and prescription drug management:  I ordered medication including  Medications  sodium chloride  0.9 % bolus 1,000 mL (0 mLs Intravenous Stopped 05/12/24 2309)  famotidine  (PEPCID ) IVPB 20 mg premix (0 mg Intravenous Stopped 05/12/24 2239)  alum & mag hydroxide-simeth (MAALOX/MYLANTA) 200-200-20 MG/5ML suspension 30 mL (30 mLs Oral Given 05/12/24 2209)    And  lidocaine  (XYLOCAINE ) 2 % viscous mouth solution 15 mL (15 mLs Oral Given 05/12/24 2209)   for abdominal pain nausea and vomiting Reevaluation of the patient after these medicines showed that the patient resolved-patient requesting to eat food and drink liquids and was able to hold them down I have reviewed the patients home medicines and have made adjustments as needed  Test Considered:   I considered CT imaging however patient seen 6 days before with extensive workup including a CT of the abdomen which was negative for any acute findings  Critical Interventions:   fluid resuscitation antiemetics pain control  Consultations Obtained:  Problem List / ED Course:     ICD-10-CM   1. Epigastric abdominal pain  R10.13     2. Other microscopic hematuria  R31.29     3. Nausea and vomiting, unspecified vomiting type  R11.2        MDM:  Patient here with complaint of abdominal pain nausea and vomiting.  She has a history of chronic alcohol abuse and previous episode of pancreatitis secondary to her alcohol abuse.  CT scan from 6 days ago showed no evidence of chronic or acute pancreatitis.  She is not tender in the left upper quadrant..  I suspect patient likely has gastritis.  She improved significantly with medications given here in the emergency department was pain-free and nausea free after treatment.  I extremely low suspicion for acute coronary syndrome.  Patient advised strongly to stop drinking alcohol and smoking cigarettes.  Given information in written form for help with these issues.  She will be started on PPI and she is given GI follow-up.  Discussed outpatient follow-up and return precautions.   Dispostion:  After consideration of the diagnostic results and the patients response to treatment, I feel that the patent would benefit from discharge with strict return precautions and close outpatient follow-up.      Final diagnoses:  Epigastric abdominal pain  Other microscopic hematuria  Nausea and vomiting, unspecified vomiting type    ED Discharge Orders          Ordered    metoCLOPramide  (REGLAN ) 10 MG tablet  Every 8 hours PRN        05/12/24 2356    pantoprazole  (PROTONIX ) 20 MG tablet  Daily        05/12/24 2356    sucralfate  (CARAFATE ) 1 g tablet  3 times daily with meals & bedtime        05/12/24 2356               Arloa Chroman, PA-C 05/14/24 1334    Horton, Roxie HERO, DO 05/14/24 2228

## 2024-05-14 LAB — URINE CULTURE

## 2024-05-20 ENCOUNTER — Encounter (HOSPITAL_COMMUNITY): Payer: Self-pay

## 2024-05-20 ENCOUNTER — Inpatient Hospital Stay (HOSPITAL_COMMUNITY)
Admission: EM | Admit: 2024-05-20 | Discharge: 2024-05-31 | DRG: 392 | Disposition: A | Discharge status: Home / Self Care | Attending: Family Medicine | Admitting: Family Medicine

## 2024-05-20 ENCOUNTER — Observation Stay (HOSPITAL_COMMUNITY)

## 2024-05-20 ENCOUNTER — Other Ambulatory Visit: Payer: Self-pay

## 2024-05-20 DIAGNOSIS — E876 Hypokalemia: Secondary | ICD-10-CM | POA: Diagnosis present

## 2024-05-20 DIAGNOSIS — Z8261 Family history of arthritis: Secondary | ICD-10-CM

## 2024-05-20 DIAGNOSIS — E669 Obesity, unspecified: Secondary | ICD-10-CM | POA: Diagnosis present

## 2024-05-20 DIAGNOSIS — E871 Hypo-osmolality and hyponatremia: Secondary | ICD-10-CM | POA: Diagnosis present

## 2024-05-20 DIAGNOSIS — H9190 Unspecified hearing loss, unspecified ear: Secondary | ICD-10-CM

## 2024-05-20 DIAGNOSIS — R001 Bradycardia, unspecified: Secondary | ICD-10-CM | POA: Diagnosis present

## 2024-05-20 DIAGNOSIS — E8721 Acute metabolic acidosis: Secondary | ICD-10-CM | POA: Diagnosis present

## 2024-05-20 DIAGNOSIS — M8580 Other specified disorders of bone density and structure, unspecified site: Secondary | ICD-10-CM | POA: Diagnosis present

## 2024-05-20 DIAGNOSIS — Z823 Family history of stroke: Secondary | ICD-10-CM

## 2024-05-20 DIAGNOSIS — K3189 Other diseases of stomach and duodenum: Secondary | ICD-10-CM | POA: Diagnosis present

## 2024-05-20 DIAGNOSIS — Z880 Allergy status to penicillin: Secondary | ICD-10-CM

## 2024-05-20 DIAGNOSIS — Z8249 Family history of ischemic heart disease and other diseases of the circulatory system: Secondary | ICD-10-CM

## 2024-05-20 DIAGNOSIS — Z886 Allergy status to analgesic agent status: Secondary | ICD-10-CM

## 2024-05-20 DIAGNOSIS — F1010 Alcohol abuse, uncomplicated: Secondary | ICD-10-CM | POA: Diagnosis present

## 2024-05-20 DIAGNOSIS — R1110 Vomiting, unspecified: Secondary | ICD-10-CM | POA: Diagnosis present

## 2024-05-20 DIAGNOSIS — R112 Nausea with vomiting, unspecified: Secondary | ICD-10-CM | POA: Diagnosis not present

## 2024-05-20 DIAGNOSIS — I10 Essential (primary) hypertension: Secondary | ICD-10-CM | POA: Diagnosis present

## 2024-05-20 DIAGNOSIS — Z833 Family history of diabetes mellitus: Secondary | ICD-10-CM

## 2024-05-20 DIAGNOSIS — I700 Atherosclerosis of aorta: Secondary | ICD-10-CM | POA: Diagnosis present

## 2024-05-20 DIAGNOSIS — R9431 Abnormal electrocardiogram [ECG] [EKG]: Secondary | ICD-10-CM | POA: Diagnosis present

## 2024-05-20 DIAGNOSIS — Z83438 Family history of other disorder of lipoprotein metabolism and other lipidemia: Secondary | ICD-10-CM

## 2024-05-20 DIAGNOSIS — N83202 Unspecified ovarian cyst, left side: Secondary | ICD-10-CM | POA: Diagnosis present

## 2024-05-20 DIAGNOSIS — M4328 Fusion of spine, sacral and sacrococcygeal region: Secondary | ICD-10-CM | POA: Diagnosis present

## 2024-05-20 DIAGNOSIS — K429 Umbilical hernia without obstruction or gangrene: Secondary | ICD-10-CM | POA: Diagnosis present

## 2024-05-20 DIAGNOSIS — G4733 Obstructive sleep apnea (adult) (pediatric): Secondary | ICD-10-CM | POA: Diagnosis present

## 2024-05-20 DIAGNOSIS — Z8639 Personal history of other endocrine, nutritional and metabolic disease: Secondary | ICD-10-CM

## 2024-05-20 DIAGNOSIS — K5732 Diverticulitis of large intestine without perforation or abscess without bleeding: Principal | ICD-10-CM | POA: Diagnosis present

## 2024-05-20 DIAGNOSIS — K760 Fatty (change of) liver, not elsewhere classified: Secondary | ICD-10-CM | POA: Diagnosis present

## 2024-05-20 DIAGNOSIS — F17210 Nicotine dependence, cigarettes, uncomplicated: Secondary | ICD-10-CM | POA: Diagnosis present

## 2024-05-20 DIAGNOSIS — Z603 Acculturation difficulty: Secondary | ICD-10-CM | POA: Diagnosis present

## 2024-05-20 DIAGNOSIS — Z79899 Other long term (current) drug therapy: Secondary | ICD-10-CM

## 2024-05-20 DIAGNOSIS — K2980 Duodenitis without bleeding: Secondary | ICD-10-CM | POA: Diagnosis present

## 2024-05-20 DIAGNOSIS — Z6829 Body mass index (BMI) 29.0-29.9, adult: Secondary | ICD-10-CM

## 2024-05-20 DIAGNOSIS — K2970 Gastritis, unspecified, without bleeding: Secondary | ICD-10-CM | POA: Diagnosis present

## 2024-05-20 DIAGNOSIS — Z8719 Personal history of other diseases of the digestive system: Secondary | ICD-10-CM

## 2024-05-20 DIAGNOSIS — H913 Deaf nonspeaking, not elsewhere classified: Secondary | ICD-10-CM | POA: Diagnosis present

## 2024-05-20 DIAGNOSIS — K269 Duodenal ulcer, unspecified as acute or chronic, without hemorrhage or perforation: Secondary | ICD-10-CM | POA: Diagnosis present

## 2024-05-20 DIAGNOSIS — K8020 Calculus of gallbladder without cholecystitis without obstruction: Secondary | ICD-10-CM | POA: Diagnosis present

## 2024-05-20 LAB — CBC WITH DIFFERENTIAL/PLATELET
Abs Immature Granulocytes: 0.01 K/uL (ref 0.00–0.07)
Basophils Absolute: 0 K/uL (ref 0.0–0.1)
Basophils Relative: 0 %
Eosinophils Absolute: 0 K/uL (ref 0.0–0.5)
Eosinophils Relative: 0 %
HCT: 54.5 % — ABNORMAL HIGH (ref 36.0–46.0)
Hemoglobin: 18.2 g/dL — ABNORMAL HIGH (ref 12.0–15.0)
Immature Granulocytes: 0 %
Lymphocytes Relative: 27 %
Lymphs Abs: 1.6 K/uL (ref 0.7–4.0)
MCH: 30 pg (ref 26.0–34.0)
MCHC: 33.4 g/dL (ref 30.0–36.0)
MCV: 89.9 fL (ref 80.0–100.0)
Monocytes Absolute: 0.5 K/uL (ref 0.1–1.0)
Monocytes Relative: 8 %
Neutro Abs: 3.7 K/uL (ref 1.7–7.7)
Neutrophils Relative %: 65 %
Platelets: 327 K/uL (ref 150–400)
RBC: 6.06 MIL/uL — ABNORMAL HIGH (ref 3.87–5.11)
RDW: 13.6 % (ref 11.5–15.5)
WBC: 5.7 K/uL (ref 4.0–10.5)
nRBC: 0 % (ref 0.0–0.2)

## 2024-05-20 LAB — COMPREHENSIVE METABOLIC PANEL WITH GFR
ALT: 25 U/L (ref 0–44)
AST: 26 U/L (ref 15–41)
Albumin: 3.7 g/dL (ref 3.5–5.0)
Alkaline Phosphatase: 85 U/L (ref 38–126)
Anion gap: 22 — ABNORMAL HIGH (ref 5–15)
BUN: 9 mg/dL (ref 8–23)
CO2: 17 mmol/L — ABNORMAL LOW (ref 22–32)
Calcium: 9.9 mg/dL (ref 8.9–10.3)
Chloride: 94 mmol/L — ABNORMAL LOW (ref 98–111)
Creatinine, Ser: 0.89 mg/dL (ref 0.44–1.00)
GFR, Estimated: >60 mL/min (ref >60)
Glucose, Bld: 94 mg/dL (ref 70–99)
Potassium: 3.7 mmol/L (ref 3.5–5.1)
Sodium: 133 mmol/L — ABNORMAL LOW (ref 135–145)
Total Bilirubin: 1.4 mg/dL — ABNORMAL HIGH (ref 0.0–1.2)
Total Protein: 7.4 g/dL (ref 6.5–8.1)

## 2024-05-20 LAB — GLUCOSE, CAPILLARY: Glucose-Capillary: 84 mg/dL (ref 70–99)

## 2024-05-20 LAB — LIPASE, BLOOD: Lipase: 82 U/L — ABNORMAL HIGH (ref 11–51)

## 2024-05-20 MED ORDER — HYDRALAZINE HCL 20 MG/ML IJ SOLN
10.0000 mg | Freq: Four times a day (QID) | INTRAMUSCULAR | Status: DC | PRN
  Administered 2024-05-29: 10 mg via INTRAVENOUS
  Filled 2024-05-20: qty 1

## 2024-05-20 MED ORDER — IOHEXOL 300 MG/ML  SOLN
100.0000 mL | Freq: Once | INTRAMUSCULAR | Status: AC | PRN
  Administered 2024-05-20: 100 mL via INTRAVENOUS

## 2024-05-20 MED ORDER — METOCLOPRAMIDE HCL 5 MG/ML IJ SOLN
10.0000 mg | Freq: Once | INTRAMUSCULAR | Status: AC
  Administered 2024-05-20: 10 mg via INTRAVENOUS
  Filled 2024-05-20: qty 2

## 2024-05-20 MED ORDER — ENOXAPARIN SODIUM 40 MG/0.4ML IJ SOSY
40.0000 mg | PREFILLED_SYRINGE | INTRAMUSCULAR | Status: DC
  Administered 2024-05-21 – 2024-05-31 (×11): 40 mg via SUBCUTANEOUS
  Filled 2024-05-20 (×11): qty 0.4

## 2024-05-20 MED ORDER — SODIUM CHLORIDE 0.9 % IV BOLUS
1000.0000 mL | Freq: Once | INTRAVENOUS | Status: AC
  Administered 2024-05-20: 1000 mL via INTRAVENOUS

## 2024-05-20 MED ORDER — SUCRALFATE 1 G PO TABS
1.0000 g | ORAL_TABLET | Freq: Three times a day (TID) | ORAL | Status: DC
  Administered 2024-05-21: 1 g via ORAL
  Filled 2024-05-20: qty 1

## 2024-05-20 MED ORDER — PANTOPRAZOLE SODIUM 40 MG IV SOLR
40.0000 mg | INTRAVENOUS | Status: DC
  Administered 2024-05-20: 40 mg via INTRAVENOUS
  Filled 2024-05-20: qty 10

## 2024-05-20 MED ORDER — LACTATED RINGERS IV SOLN: INTRAVENOUS | Status: DC

## 2024-05-20 MED ORDER — ONDANSETRON HCL 4 MG/2ML IJ SOLN
4.0000 mg | Freq: Once | INTRAMUSCULAR | Status: AC
  Administered 2024-05-20: 4 mg via INTRAVENOUS
  Filled 2024-05-20: qty 2

## 2024-05-20 MED ORDER — POLYETHYLENE GLYCOL 3350 17 G PO PACK
17.0000 g | PACK | Freq: Every day | ORAL | Status: DC | PRN
  Administered 2024-05-22: 17 g via ORAL
  Filled 2024-05-20: qty 1

## 2024-05-20 MED ORDER — KETOROLAC TROMETHAMINE 30 MG/ML IJ SOLN
30.0000 mg | Freq: Once | INTRAMUSCULAR | Status: AC
  Administered 2024-05-20: 30 mg via INTRAVENOUS
  Filled 2024-05-20: qty 1

## 2024-05-20 MED ORDER — METOCLOPRAMIDE HCL 5 MG/ML IJ SOLN
5.0000 mg | Freq: Four times a day (QID) | INTRAMUSCULAR | Status: DC | PRN
  Administered 2024-05-21 – 2024-05-23 (×7): 5 mg via INTRAVENOUS
  Filled 2024-05-20 (×7): qty 2

## 2024-05-20 NOTE — ED Notes (Addendum)
 Patient unable to urinate at this time. Patient ambulated to restroom without assist, no difficulty.  IV fluids are running currently.  Patient has been given pencil and paper to facilitate needs and translator in room for longer conversations.

## 2024-05-20 NOTE — ED Notes (Signed)
Patient provided with additional blankets.

## 2024-05-20 NOTE — ED Provider Triage Note (Signed)
 Emergency Medicine Provider Triage Evaluation Note  Shelley [name] , a 63 y.o. female  was evaluated in triage.  Pt complains of vomiting and unable to keep fluids down. Vomiting every few minutes. Has been having hard stools. Last BM today. Hx of pancreatitis. No ETOH use  Family is interested in admission as she is unable to keep liquids, foods, medicine down  Has been evaluated on 05/06/24, 05/12/24 for similar complaints. CT from 05/06/24 wo acute findings  Clarissa #[id number] ASL interpretor  Review of Systems  Positive: See hpi Negative:   Physical Exam  BP (!) 145/90 (BP Location: Left Arm)   Pulse 70   Temp 98 F (36.7 C) (Oral)   Resp 18   SpO2 100%  Gen:   Awake, no distress   Resp:  Normal effort  MSK:   Moves extremities without difficulty  Other:    Medical Decision Making  Medically screening exam initiated at 5:38 PM.  Appropriate orders placed.  MIAMARIE MOLL was informed that the remainder of the evaluation will be completed by another provider, this initial triage assessment does not replace that evaluation, and the importance of remaining in the ED until their evaluation is complete.  Labs ordered   Minnie Tinnie BRAVO, [place] 05/20/24 1743

## 2024-05-20 NOTE — ED Notes (Signed)
 Pt unable to provide a urine sample at this time.

## 2024-05-20 NOTE — Discharge Instructions (Addendum)
 Advised to follow-up with primary care physician in 1 week. Advised to take scopolamine  patch every 3 days as needed for nausea and vomiting Advised to continue pantoprazole  40 mg daily Advised to take take on as needed for worsening nausea and vomiting.   Follow-up with GI as scheduled

## 2024-05-20 NOTE — H&P (Signed)
 History and Physical    Patient: Shelley [name] FMW:[id number] DOB: 06-26-1961 DOA: 05/20/2024 DOS: the patient was seen and examined on 05/20/2024 PCP: Ilah Crigler, MD  Patient coming from: Home  Chief Complaint:  Chief Complaint  Patient presents with   Vomiting   HPI: Shelley [name] is a 63 y.o. female with medical history significant of deafness, sleep apnea, alcohol abuse (has not had any alcohol since earlier this month), presents to the ED for symptoms of recurrent nausea and vomiting, upper abdominal discomfort.  Patient has been experiencing the symptoms for the past 2 weeks or so.  She has been seen in the ER for the symptoms on August 5 and again on August 11.  She had a CT scan of the abdomen and pelvis 2 weeks ago that did not show any acute process.  She was previously treated with IV fluids and nausea medications and was discharged home, however she returns for recurrent symptoms.  She has a history of hospitalization a couple years ago for similar symptoms. Her workup in the ER shows that her vital signs relatively stable.  Serum sodium is slightly low at 133.  Anion gap somewhat elevated at 22.  Rest of the CMP is relatively unremarkable.  Lipase is mildly elevated 82.  Hemoglobin is 18.2, WBC count is 5.7   Review of Systems: As mentioned in the history of present illness. All other systems reviewed and are negative. Past Medical History:  Diagnosis Date   Allergy    Deaf    OSA on CPAP 10/03/2011   Thyroid  disease    History reviewed. No pertinent surgical history. Social History:  reports that she has been smoking cigarettes. She has never used smokeless tobacco. She reports that she does not drink alcohol and does not use drugs.  Allergies  Allergen Reactions   Penicillins Other (See Comments)    Caused deafness per pt   Tylenol [Acetaminophen] Other (See Comments)    Causes shaking    Family History  Problem Relation Age of Onset   Arthritis Mother     Diabetes Mother    Heart disease Father    Hyperlipidemia Father    Hypertension Father    Stroke Father    Alcohol abuse Neg Hx    Cancer Neg Hx    Early death Neg Hx    Kidney disease Neg Hx    Breast cancer Neg Hx     Prior to Admission medications   Medication Sig Start Date End Date Taking? Authorizing Provider  metoCLOPramide  (REGLAN ) 10 MG tablet Take 1 tablet (10 mg total) by mouth every 8 (eight) hours as needed for nausea or vomiting. 05/12/24   Harris, Abigail, PA-C  ondansetron  (ZOFRAN ) 4 MG tablet Take 1 tablet (4 mg total) by mouth every 8 (eight) hours as needed for nausea or vomiting. 01/09/22   Christobal Guadalajara, MD  pantoprazole  (PROTONIX ) 20 MG tablet Take 1 tablet (20 mg total) by mouth daily. 05/12/24   Harris, Abigail, PA-C  pantoprazole  (PROTONIX ) 40 MG tablet Take 1 tablet (40 mg total) by mouth daily. 01/09/22 02/08/22  Christobal Guadalajara, MD  potassium chloride  SA (KLOR-CON  M) 20 MEQ tablet Take 2 tablets (40 mEq total) by mouth daily for 5 days. 05/06/24 05/11/24  Long, Fonda MATSU, MD  sucralfate  (CARAFATE ) 1 g tablet Take 1 tablet (1 g total) by mouth 4 (four) times daily -  with meals and at bedtime. 05/12/24   Arloa Chroman, PA-C    Physical  Exam: Vitals:   05/20/24 1720 05/20/24 2043  BP: (!) 145/90 (!) 147/81  Pulse: 70 (!) 55  Resp: 18 18  Temp: 98 F (36.7 C) 98.4 F (36.9 C)  TempSrc: Oral Oral  SpO2: 100% 100%   Physical Exam HENT:     Head: Normocephalic and atraumatic.     Comments: Patient has impaired hearing and needs to have sign language interpreter    Nose: Nose normal.     Mouth/Throat:     Mouth: Mucous membranes are moist.  Eyes:     Pupils: Pupils are equal, round, and reactive to light.  Cardiovascular:     Rate and Rhythm: Normal rate and regular rhythm.  Pulmonary:     Effort: Pulmonary effort is normal.     Breath sounds: Normal breath sounds.  Abdominal:     General: Abdomen is flat. There is no distension.     Palpations: Abdomen is  soft. There is no mass.     Tenderness: There is no abdominal tenderness. There is no guarding or rebound.  Musculoskeletal:        General: Normal range of motion.     Cervical back: Neck supple.  Skin:    General: Skin is warm.     Capillary Refill: Capillary refill takes less than 2 seconds.  Neurological:     General: No focal deficit present.     Mental Status: She is alert.  Psychiatric:        Mood and Affect: Mood normal.     Data Reviewed:  I personally reviewed patient's labs, previous CT scan results, previous records.  Assessment and Plan: No notes have been filed under this hospital service. Service: Hospitalist  #1.  Intractable nausea and vomiting.  It appears to be a recurrent issue for the patient.  Etiology could be related to patient's alcohol use, even though she has not had any alcohol over the past 2 weeks or so.  Will admit patient, continue with IV hydration, will keep patient n.p.o. for the time being.  Will use IV Reglan  for now for her symptoms of nausea and vomiting.  Will also start IV Protonix . Will go ahead and repeat CT abdomen and pelvis to compare with the previous CT scan due to patient's recurrent and persistent symptoms. Consider GI consultation tomorrow if symptoms are not better.  #2.  Mild hyponatremia.  Likely related to nausea and vomiting.  Will repeat labs in the morning.  #3.  Mildly elevated lipase.  Recent CT abdomen pelvis did not reveal any signs of pancreatic inflammation.  Will await results of the CT abdomen pelvis from today.  Repeat lipase in the morning.  #4.  Alcohol abuse.  Patient reports not having any alcohol in the past couple of weeks.  Does not seem to have any signs of withdrawals.    Advance Care Planning:   Code Status: Full Code   Consults:    Severity of Illness: The appropriate patient status for this patient is OBSERVATION. Observation status is judged to be reasonable and necessary in order to provide the  required intensity of service to ensure the patient's safety. The patient's presenting symptoms, physical exam findings, and initial radiographic and laboratory data in the context of their medical condition is felt to place them at decreased risk for further clinical deterioration. Furthermore, it is anticipated that the patient will be medically stable for discharge from the hospital within 2 midnights of admission.   Author: Ohana Birdwell A Saqib Cazarez, MD  05/20/2024 10:29 PM  For on call review [email or web address].

## 2024-05-20 NOTE — ED Provider Notes (Signed)
 [place] EMERGENCY DEPARTMENT AT [facility] Provider Note   CSN: [id number] Arrival date & time: 05/20/24  1709     Patient presents with: Vomiting   Shelley [name] is a 63 y.o. female who is deaf, sign language interpreter used, presented to ED with persistent abdominal pain and nausea and vomiting.  Her niece at the bedside provides supplemental history.  She reports the patient's been having epigastric pain and vomiting since May 06, 2013 days ago.  She has been seen twice in the ED prior to this including most recently on the 11th, 1 week ago, where she had a CT imaging of the abdomen that showed no emergent process.  She has not been able to get control of her symptoms at home.  She says she vomits if she tried to eat or drink anything.  She was drinking alcohol in the past in the evening but has stopped that since this current bout.  She does not smoke.  CT imaging from August 5 shows unremarkable gallbladder, normal pancreas, nonspecific ovarian cyst.   HPI     Prior to Admission medications   Medication Sig Start Date End Date Taking? Authorizing Provider  metoCLOPramide  (REGLAN ) 10 MG tablet Take 1 tablet (10 mg total) by mouth every 8 (eight) hours as needed for nausea or vomiting. 05/12/24   Harris, Abigail, PA-C  ondansetron  (ZOFRAN ) 4 MG tablet Take 1 tablet (4 mg total) by mouth every 8 (eight) hours as needed for nausea or vomiting. 01/09/22   Christobal Guadalajara, MD  pantoprazole  (PROTONIX ) 20 MG tablet Take 1 tablet (20 mg total) by mouth daily. 05/12/24   Harris, Abigail, PA-C  pantoprazole  (PROTONIX ) 40 MG tablet Take 1 tablet (40 mg total) by mouth daily. 01/09/22 02/08/22  Christobal Guadalajara, MD  potassium chloride  SA (KLOR-CON  M) 20 MEQ tablet Take 2 tablets (40 mEq total) by mouth daily for 5 days. 05/06/24 05/11/24  Long, Fonda MATSU, MD  sucralfate  (CARAFATE ) 1 g tablet Take 1 tablet (1 g total) by mouth 4 (four) times daily -  with meals and at bedtime. 05/12/24   Harris,  Abigail, PA-C    Allergies: Penicillins and Tylenol [acetaminophen]    Review of Systems  Updated Vital Signs BP (!) 147/81 (BP Location: Right Arm)   Pulse (!) 55   Temp 98.4 F (36.9 C) (Oral)   Resp 18   SpO2 100%   Physical Exam Constitutional:      General: She is not in acute distress. HENT:     Head: Normocephalic and atraumatic.  Eyes:     Conjunctiva/sclera: Conjunctivae normal.     Pupils: Pupils are equal, round, and reactive to light.  Cardiovascular:     Rate and Rhythm: Normal rate and regular rhythm.  Pulmonary:     Effort: Pulmonary effort is normal. No respiratory distress.  Abdominal:     General: There is no distension.     Tenderness: There is abdominal tenderness. There is no guarding or rebound.  Skin:    General: Skin is warm and dry.  Neurological:     General: No focal deficit present.     Mental Status: She is alert. Mental status is at baseline.  Psychiatric:        Mood and Affect: Mood normal.        Behavior: Behavior normal.     (all labs ordered are listed, but only abnormal results are displayed) Labs Reviewed  COMPREHENSIVE METABOLIC PANEL WITH GFR - Abnormal;  Notable for the following components:      Result Value   Sodium 133 (*)    Chloride 94 (*)    CO2 17 (*)    Total Bilirubin 1.4 (*)    Anion gap 22 (*)    All other components within normal limits  LIPASE, BLOOD - Abnormal; Notable for the following components:   Lipase 82 (*)    All other components within normal limits  CBC WITH DIFFERENTIAL/PLATELET - Abnormal; Notable for the following components:   RBC 6.06 (*)    Hemoglobin 18.2 (*)    HCT 54.5 (*)    All other components within normal limits  URINALYSIS, ROUTINE W REFLEX MICROSCOPIC    EKG: None  Radiology: No results found.   Procedures   Medications Ordered in the ED  sodium chloride  0.9 % bolus 1,000 mL (1,000 mLs Intravenous New Bag/Given 05/20/24 2100)  metoCLOPramide  (REGLAN ) injection 10 mg  (10 mg Intravenous Given 05/20/24 2058)  ketorolac  (TORADOL ) 30 MG/ML injection 30 mg (30 mg Intravenous Given 05/20/24 2059)  ondansetron  (ZOFRAN ) injection 4 mg (4 mg Intravenous Given 05/20/24 2139)    Clinical Course as of 05/20/24 2228  Tue May 20, 2024  2152 Admitted to hospitalist [MT]    Clinical Course User Index [MT] Cottie Donnice PARAS, MD                                 Medical Decision Making Amount and/or Complexity of Data Reviewed Labs: ordered.  Risk Prescription drug management. Decision regarding hospitalization.   This patient presents to the ED with concern for abdominal pain. This involves an extensive number of treatment options, and is a complaint that carries with it a high risk of complications and morbidity.  The differential diagnosis includes gastritis versus biliary disease versus pancreatitis versus other  Additional history obtained from patient's niece at bedside  External records from outside source obtained and reviewed including prior ED visits including CT imaging of the abdomen.  Patient has a history of acute pancreatitis, likely alcohol related, most recently admitted in April 2023  I ordered and personally interpreted labs.  The pertinent results include: No leukocytosis.  Very mild elevation of lipase, 82, anion gap 22   The patient was maintained on a cardiac monitor.  I personally viewed and interpreted the cardiac monitored which showed an underlying rhythm of: Sinus rhythm  I ordered medication including IV pain and nausea medication  I have reviewed the patients home medicines and have made adjustments as needed  Test Considered: Patient has an overall benign abdominal exam and I do not see an emergent indication for repeat CT imaging of the abdomen at this time, given that this was done 2 weeks ago.  After the interventions noted above, I reevaluated the patient and found that they have: stayed the same  Social Determinants of  Health: a sign language and translator interpreter was used, Scientist, research (medical)  Disposition:  After consideration of the diagnostic results and the patient's response to treatment, I feel that the patient would benefit from medical admission.      Final diagnoses:  Nausea and vomiting, unspecified vomiting type    ED Discharge Orders     None          Cottie Donnice PARAS, MD 05/20/24 2228

## 2024-05-20 NOTE — ED Triage Notes (Addendum)
 Pt reports with vomiting for a few weeks that has not resolved. Pt tries to take the medication but cannot keep it down. Pt reports that the vomit is brown at times.

## 2024-05-21 DIAGNOSIS — Z6829 Body mass index (BMI) 29.0-29.9, adult: Secondary | ICD-10-CM | POA: Diagnosis not present

## 2024-05-21 DIAGNOSIS — E876 Hypokalemia: Secondary | ICD-10-CM | POA: Diagnosis present

## 2024-05-21 DIAGNOSIS — F17210 Nicotine dependence, cigarettes, uncomplicated: Secondary | ICD-10-CM | POA: Diagnosis present

## 2024-05-21 DIAGNOSIS — K5732 Diverticulitis of large intestine without perforation or abscess without bleeding: Secondary | ICD-10-CM | POA: Diagnosis present

## 2024-05-21 DIAGNOSIS — K8020 Calculus of gallbladder without cholecystitis without obstruction: Secondary | ICD-10-CM | POA: Diagnosis present

## 2024-05-21 DIAGNOSIS — K3189 Other diseases of stomach and duodenum: Secondary | ICD-10-CM | POA: Diagnosis present

## 2024-05-21 DIAGNOSIS — E8721 Acute metabolic acidosis: Secondary | ICD-10-CM | POA: Diagnosis present

## 2024-05-21 DIAGNOSIS — E871 Hypo-osmolality and hyponatremia: Secondary | ICD-10-CM | POA: Diagnosis present

## 2024-05-21 DIAGNOSIS — R001 Bradycardia, unspecified: Secondary | ICD-10-CM | POA: Diagnosis present

## 2024-05-21 DIAGNOSIS — K5792 Diverticulitis of intestine, part unspecified, without perforation or abscess without bleeding: Secondary | ICD-10-CM | POA: Diagnosis not present

## 2024-05-21 DIAGNOSIS — I4581 Long QT syndrome: Secondary | ICD-10-CM | POA: Diagnosis not present

## 2024-05-21 DIAGNOSIS — R112 Nausea with vomiting, unspecified: Secondary | ICD-10-CM | POA: Diagnosis present

## 2024-05-21 DIAGNOSIS — F1010 Alcohol abuse, uncomplicated: Secondary | ICD-10-CM | POA: Diagnosis present

## 2024-05-21 DIAGNOSIS — H913 Deaf nonspeaking, not elsewhere classified: Secondary | ICD-10-CM | POA: Diagnosis present

## 2024-05-21 DIAGNOSIS — M4328 Fusion of spine, sacral and sacrococcygeal region: Secondary | ICD-10-CM | POA: Diagnosis present

## 2024-05-21 DIAGNOSIS — K760 Fatty (change of) liver, not elsewhere classified: Secondary | ICD-10-CM | POA: Diagnosis present

## 2024-05-21 DIAGNOSIS — G4730 Sleep apnea, unspecified: Secondary | ICD-10-CM | POA: Diagnosis not present

## 2024-05-21 DIAGNOSIS — R9431 Abnormal electrocardiogram [ECG] [EKG]: Secondary | ICD-10-CM | POA: Diagnosis present

## 2024-05-21 DIAGNOSIS — M8580 Other specified disorders of bone density and structure, unspecified site: Secondary | ICD-10-CM | POA: Diagnosis present

## 2024-05-21 DIAGNOSIS — R1110 Vomiting, unspecified: Secondary | ICD-10-CM | POA: Diagnosis present

## 2024-05-21 DIAGNOSIS — K2970 Gastritis, unspecified, without bleeding: Secondary | ICD-10-CM | POA: Diagnosis present

## 2024-05-21 DIAGNOSIS — E669 Obesity, unspecified: Secondary | ICD-10-CM | POA: Diagnosis present

## 2024-05-21 DIAGNOSIS — K429 Umbilical hernia without obstruction or gangrene: Secondary | ICD-10-CM | POA: Diagnosis present

## 2024-05-21 DIAGNOSIS — K269 Duodenal ulcer, unspecified as acute or chronic, without hemorrhage or perforation: Secondary | ICD-10-CM | POA: Diagnosis present

## 2024-05-21 DIAGNOSIS — G4733 Obstructive sleep apnea (adult) (pediatric): Secondary | ICD-10-CM | POA: Diagnosis present

## 2024-05-21 DIAGNOSIS — I700 Atherosclerosis of aorta: Secondary | ICD-10-CM | POA: Diagnosis present

## 2024-05-21 DIAGNOSIS — I10 Essential (primary) hypertension: Secondary | ICD-10-CM | POA: Diagnosis present

## 2024-05-21 DIAGNOSIS — Z603 Acculturation difficulty: Secondary | ICD-10-CM | POA: Diagnosis present

## 2024-05-21 LAB — CBC
HCT: 49 % — ABNORMAL HIGH (ref 36.0–46.0)
Hemoglobin: 16.5 g/dL — ABNORMAL HIGH (ref 12.0–15.0)
MCH: 30.7 pg (ref 26.0–34.0)
MCHC: 33.7 g/dL (ref 30.0–36.0)
MCV: 91.1 fL (ref 80.0–100.0)
Platelets: 265 K/uL (ref 150–400)
RBC: 5.38 MIL/uL — ABNORMAL HIGH (ref 3.87–5.11)
RDW: 13.5 % (ref 11.5–15.5)
WBC: 4.7 K/uL (ref 4.0–10.5)
nRBC: 0 % (ref 0.0–0.2)

## 2024-05-21 LAB — COMPREHENSIVE METABOLIC PANEL WITH GFR
ALT: 21 U/L (ref 0–44)
AST: 17 U/L (ref 15–41)
Albumin: 3 g/dL — ABNORMAL LOW (ref 3.5–5.0)
Alkaline Phosphatase: 71 U/L (ref 38–126)
Anion gap: 15 (ref 5–15)
BUN: 8 mg/dL (ref 8–23)
CO2: 20 mmol/L — ABNORMAL LOW (ref 22–32)
Calcium: 8.9 mg/dL (ref 8.9–10.3)
Chloride: 97 mmol/L — ABNORMAL LOW (ref 98–111)
Creatinine, Ser: 0.71 mg/dL (ref 0.44–1.00)
GFR, Estimated: >60 mL/min (ref >60)
Glucose, Bld: 81 mg/dL (ref 70–99)
Potassium: 3.4 mmol/L — ABNORMAL LOW (ref 3.5–5.1)
Sodium: 132 mmol/L — ABNORMAL LOW (ref 135–145)
Total Bilirubin: 1.1 mg/dL (ref 0.0–1.2)
Total Protein: 6.1 g/dL — ABNORMAL LOW (ref 6.5–8.1)

## 2024-05-21 LAB — LIPASE, BLOOD: Lipase: 108 U/L — ABNORMAL HIGH (ref 11–51)

## 2024-05-21 LAB — GLUCOSE, CAPILLARY
Glucose-Capillary: 160 mg/dL — ABNORMAL HIGH (ref 70–99)
Glucose-Capillary: 92 mg/dL (ref 70–99)
Glucose-Capillary: 96 mg/dL (ref 70–99)

## 2024-05-21 LAB — PHOSPHORUS: Phosphorus: 3.6 mg/dL (ref 2.5–4.6)

## 2024-05-21 LAB — PROTIME-INR
INR: 1 (ref 0.8–1.2)
Prothrombin Time: 13.3 s (ref 11.4–15.2)

## 2024-05-21 LAB — MAGNESIUM: Magnesium: 2.1 mg/dL (ref 1.7–2.4)

## 2024-05-21 LAB — HIV ANTIBODY (ROUTINE TESTING W REFLEX): HIV Screen 4th Generation wRfx: NONREACTIVE

## 2024-05-21 MED ORDER — TRAZODONE HCL 50 MG PO TABS
50.0000 mg | ORAL_TABLET | Freq: Every evening | ORAL | Status: DC | PRN
  Filled 2024-05-21: qty 1

## 2024-05-21 MED ORDER — PANTOPRAZOLE SODIUM 40 MG IV SOLR
40.0000 mg | Freq: Two times a day (BID) | INTRAVENOUS | Status: DC
  Administered 2024-05-21 – 2024-05-31 (×20): 40 mg via INTRAVENOUS
  Filled 2024-05-21 (×20): qty 10

## 2024-05-21 MED ORDER — SODIUM CHLORIDE 0.9 % IV SOLN
2.0000 g | INTRAVENOUS | Status: DC
  Administered 2024-05-21 – 2024-05-27 (×7): 2 g via INTRAVENOUS
  Filled 2024-05-21 (×7): qty 20

## 2024-05-21 MED ORDER — LORAZEPAM 2 MG/ML IJ SOLN: 0.2500 mg | Freq: Once | INTRAMUSCULAR | Status: AC | PRN

## 2024-05-21 MED ORDER — CIPROFLOXACIN IN D5W 400 MG/200ML IV SOLN: 400.0000 mg | Freq: Two times a day (BID) | INTRAVENOUS | Status: DC

## 2024-05-21 MED ORDER — LACTATED RINGERS IV SOLN: INTRAVENOUS | Status: AC

## 2024-05-21 MED ORDER — METRONIDAZOLE 500 MG/100ML IV SOLN
500.0000 mg | Freq: Two times a day (BID) | INTRAVENOUS | Status: DC
  Administered 2024-05-21 – 2024-05-27 (×13): 500 mg via INTRAVENOUS
  Filled 2024-05-21 (×13): qty 100

## 2024-05-21 NOTE — Progress Notes (Signed)
 [facility] Progress Note  Patient: Shelley [name]    FMW:[id number]  DOA: 05/20/2024     Date of Service: the patient was seen and examined on 05/21/2024  Chief Complaint  Patient presents with   Vomiting   Brief hospital course:  Shelley [name] is a 63 y.o. female with medical history significant of deafness, sleep apnea, alcohol abuse (has not had any alcohol since earlier this month), presents to the ED for symptoms of recurrent nausea and vomiting, upper abdominal discomfort.  Patient has been experiencing the symptoms for the past 2 weeks or so.  She has been seen in the ER for the symptoms on August 5 and again on August 11.  She had a CT scan of the abdomen and pelvis 2 weeks ago that did not show any acute process.  She was previously treated with IV fluids and nausea medications and was discharged home, however she returns for recurrent symptoms.  She has a history of hospitalization a couple years ago for similar symptoms. Her workup in the ER shows that her vital signs relatively stable.  Serum sodium is slightly low at 133.  Anion gap somewhat elevated at 22.  Rest of the CMP is relatively unremarkable.  Lipase is mildly elevated 82.  Hemoglobin is 18.2, WBC count is 5.7    Assessment and Plan:  Diverticulitis CT scan shows sigmoid diverticulitis, no abscess or perforation Patient presented with intractable nausea and vomiting. 8/20 started ceftriaxone  and metronidazole  Increased PPI 40 mg IV twice daily Continue symptomatic treatment Started clear liquid diet, advance as per tolerance    #  Mild hyponatremia.  Likely related to nausea and vomiting.  Will repeat labs in the morning.   #  Mildly elevated lipase.  Recent CT abdomen pelvis did not reveal any signs of pancreatic inflammation.  Will await results of the CT abdomen pelvis from today.   Lipase 82>>108 Trend lipase level    #  Alcohol abuse.  Patient reports not having any alcohol in the past couple of weeks.   Does not seem to have any signs of withdrawals.  # Hypertension, patient is not on any antihypertensive medication. High blood pressure possible secondary to pain Started hydralazine  as needed Monitor BP and titrate medication accordingly    Body mass index is 29.53 kg/m.  Interventions:  Diet: cld DVT Prophylaxis: Subcutaneous Lovenox    Advance goals of care discussion: Full code  Family Communication: family was present at bedside, at the time of interview.  The pt provided permission to discuss medical plan with the family. Opportunity was given to ask question and all questions were answered satisfactorily.   Disposition:  Pt is from Home, admitted with diverticulitis, still has pain, CLD, IV antibiotics, which precludes a safe discharge. Discharge to home, when stable, most likely in few days.  Subjective: No significant events overnight.  Patient still feels nauseous and vomited x 2 today.  Complaining of mild pain in the left lower quadrant.  Denied any other complaints.  Physical Exam: General: NAD, lying comfortably Appear in no distress, affect appropriate Eyes: PERRLA ENT: Oral Mucosa Clear, moist  Neck: no JVD,  Cardiovascular: S1 and S2 Present, no Murmur,  Respiratory: good respiratory effort, Bilateral Air entry equal and Decreased, no Crackles, no wheezes Abdomen: BS present, Soft, mild tenderness epigastric and LLQ Skin: no rashes Extremities: no Pedal edema, no calf tenderness Neurologic: without any new focal findings Gait not checked due to patient safety concerns  Vitals:  05/20/24 2349 05/21/24 0508 05/21/24 0958 05/21/24 1358  BP:  (!) 142/73 128/81 (!) 160/74  Pulse:  (!) 46 (!) 48 (!) 45  Resp:  16 16 16   Temp:  97.9 F (36.6 C) (!) 97.5 F (36.4 C) 98.2 F (36.8 C)  TempSrc:   Oral Oral  SpO2:  99% 98% 97%  Weight: 83 kg     Height: 5' 6 (1.676 m)       Intake/Output Summary (Last 24 hours) at 05/21/2024 1505 Last data filed at  05/21/2024 1451 Gross per 24 hour  Intake 429.27 ml  Output --  Net 429.27 ml   Filed Weights   05/20/24 2349  Weight: 83 kg    Data Reviewed: I have personally reviewed and interpreted daily labs, tele strips, imagings as discussed above. I reviewed all nursing notes, pharmacy notes, vitals, pertinent old records I have discussed plan of care as described above with RN and patient/family.  CBC: Recent Labs  Lab 05/20/24 1740 05/21/24 0444  WBC 5.7 4.7  NEUTROABS 3.7  --   HGB 18.2* 16.5*  HCT 54.5* 49.0*  MCV 89.9 91.1  PLT 327 265   Basic Metabolic Panel: Recent Labs  Lab 05/20/24 1740 05/21/24 0444  NA 133* 132*  K 3.7 3.4*  CL 94* 97*  CO2 17* 20*  GLUCOSE 94 81  BUN 9 8  CREATININE 0.89 0.71  CALCIUM 9.9 8.9  MG  --  2.1  PHOS  --  3.6    Studies: CT ABDOMEN PELVIS W CONTRAST Result Date: 05/21/2024 CLINICAL DATA:  Intractable vomiting for the past few weeks, seen for the same issue here 05/06/2024. EXAM: CT ABDOMEN AND PELVIS WITH CONTRAST TECHNIQUE: Multidetector CT imaging of the abdomen and pelvis was performed using the standard protocol following bolus administration of intravenous contrast. RADIATION DOSE REDUCTION: This exam was performed according to the departmental dose-optimization program which includes automated exposure control, adjustment of the mA and/or kV according to patient size and/or use of iterative reconstruction technique. CONTRAST:  OMNIPAQUE  IOHEXOL  300 MG/ML  SOLN COMPARISON:  CTs with IV contrast 05/06/2024 and 01/06/2022. FINDINGS: Lower chest: No acute abnormality.  Mild cardiomegaly. Hepatobiliary: Respiratory motion limits fine detail. No liver masses seen through the breathing motion. The liver is slightly steatotic. There is focal periligamentous fat in segment 4 B. There are rounded noncalcified structures in the gallbladder consistent with either noncalcified stones or tumefactive sludge. The largest of these is 2.8 cm.  This was seen previously. No biliary dilatation or wall thickening noted. Pancreas: No abnormality. Spleen: No abnormality. Adrenals/Urinary Tract: There is no adrenal mass. There is a 1.4 cm Bosniak 1 cyst in the upper pole left kidney, Hounsfield density is 0.1. No follow-up imaging recommended. There is no renal mass enhancement, calculus or obstruction. There is symmetric renal excretion on the delayed images. The bladder is unremarkable for the degree of distension. Stomach/Bowel: Unremarkable contracted stomach. Normal caliber small bowel without inflammatory changes. Normal appendix. There is diffuse colonic diverticulosis. The proximal 8 cm of the sigmoid colon again demonstrates disproportionate wall thickening and increasing inflammatory stranding compared with August 5, findings consistent with acute sigmoid diverticulitis. A follow-up colonoscopy is recommended after treatment to exclude underlying lesion given the degree of wall thickening. Rest of the colon wall has a normal thickness. No further inflammation. Vascular/Lymphatic: Aortic atherosclerosis. No enlarged abdominal or pelvic lymph nodes. Reproductive: There is a stable, simple appearing thin walled left ovarian cyst which is unchanged back to  01/06/2022. The right ovary is unremarkable. The uterus is unremarkable. No adnexal mass is seen. Other: Small umbilical fat hernia. No incarcerated hernia. No free fluid, free hemorrhage, or free air including associated with diverticulitis. Musculoskeletal: Mild osteopenia and degenerative change lumbar spine. 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 No acute or other significant osseous findings are seen. IMPRESSION: 1. Acute sigmoid diverticulitis with increasing inflammatory stranding compared with 05/06/2024. A follow-up colonoscopy is recommended after treatment to exclude underlying lesion given the degree of wall thickening. 2. No bowel obstruction or perforation. 3. Aortic  atherosclerosis. 4. Mild cardiomegaly. 5. Mild hepatic steatosis. 6. Noncalcified stones versus tumefactive sludge in the gallbladder. No biliary dilatation. 7. Stable simple appearing left ovarian cyst. No follow-up imaging recommended. 8. Umbilical fat hernia. 9. Osteopenia and degenerative change. Ankylosis over the inferior SI joints. Aortic Atherosclerosis (ICD10-I70.0). Electronically Signed   By: Francis Quam M.D.   On: 05/21/2024 00:01    Scheduled Meds:  enoxaparin  (LOVENOX ) injection  40 mg Subcutaneous Q24H   pantoprazole  (PROTONIX ) IV  40 mg Intravenous Q24H   sucralfate   1 g Oral TID WC & HS   Continuous Infusions:  cefTRIAXone  (ROCEPHIN )  IV 2 g (05/21/24 1125)   lactated ringers  100 mL/hr at 05/21/24 1002   metronidazole  500 mg (05/21/24 1005)   PRN Meds: hydrALAZINE , metoCLOPramide  (REGLAN ) injection, polyethylene glycol  Time spent: 35 minutes  Author: ELVAN SOR. MD [facility] 05/21/2024 3:05 PM  To reach On-call, see care teams to locate the attending and reach out to them via [email or web address]. If 7PM-7AM, please contact night-coverage If you still have difficulty reaching the attending provider, please page the [facility] (Director on Call) for [facility] on amion for assistance.

## 2024-05-21 NOTE — Plan of Care (Signed)

## 2024-05-21 NOTE — Plan of Care (Signed)

## 2024-05-22 DIAGNOSIS — R112 Nausea with vomiting, unspecified: Secondary | ICD-10-CM | POA: Diagnosis not present

## 2024-05-22 LAB — CBC
HCT: 47.6 % — ABNORMAL HIGH (ref 36.0–46.0)
Hemoglobin: 15.4 g/dL — ABNORMAL HIGH (ref 12.0–15.0)
MCH: 29.6 pg (ref 26.0–34.0)
MCHC: 32.4 g/dL (ref 30.0–36.0)
MCV: 91.4 fL (ref 80.0–100.0)
Platelets: 249 K/uL (ref 150–400)
RBC: 5.21 MIL/uL — ABNORMAL HIGH (ref 3.87–5.11)
RDW: 13.5 % (ref 11.5–15.5)
WBC: 3.5 K/uL — ABNORMAL LOW (ref 4.0–10.5)
nRBC: 0 % (ref 0.0–0.2)

## 2024-05-22 LAB — PHOSPHORUS: Phosphorus: 3 mg/dL (ref 2.5–4.6)

## 2024-05-22 LAB — BASIC METABOLIC PANEL WITH GFR
Anion gap: 11 (ref 5–15)
BUN: <5 mg/dL — ABNORMAL LOW (ref 8–23)
CO2: 23 mmol/L (ref 22–32)
Calcium: 8.8 mg/dL — ABNORMAL LOW (ref 8.9–10.3)
Chloride: 100 mmol/L (ref 98–111)
Creatinine, Ser: 0.61 mg/dL (ref 0.44–1.00)
GFR, Estimated: >60 mL/min (ref >60)
Glucose, Bld: 86 mg/dL (ref 70–99)
Potassium: 3.2 mmol/L — ABNORMAL LOW (ref 3.5–5.1)
Sodium: 134 mmol/L — ABNORMAL LOW (ref 135–145)

## 2024-05-22 LAB — MAGNESIUM: Magnesium: 1.6 mg/dL — ABNORMAL LOW (ref 1.7–2.4)

## 2024-05-22 LAB — GLUCOSE, CAPILLARY
Glucose-Capillary: 90 mg/dL (ref 70–99)
Glucose-Capillary: 90 mg/dL (ref 70–99)

## 2024-05-22 MED ORDER — MAGNESIUM SULFATE 2 GM/50ML IV SOLN
2.0000 g | Freq: Once | INTRAVENOUS | Status: AC
  Administered 2024-05-22: 2 g via INTRAVENOUS
  Filled 2024-05-22: qty 50

## 2024-05-22 MED ORDER — POTASSIUM CHLORIDE 10 MEQ/100ML IV SOLN
10.0000 meq | INTRAVENOUS | Status: AC
  Administered 2024-05-22 (×4): 10 meq via INTRAVENOUS
  Filled 2024-05-22 (×4): qty 100

## 2024-05-22 MED ORDER — LORAZEPAM 2 MG/ML IJ SOLN
0.2500 mg | Freq: Four times a day (QID) | INTRAMUSCULAR | Status: AC | PRN
  Administered 2024-05-22: 0.25 mg via INTRAVENOUS
  Filled 2024-05-22: qty 1

## 2024-05-22 MED ORDER — PROCHLORPERAZINE EDISYLATE 10 MG/2ML IJ SOLN
10.0000 mg | Freq: Once | INTRAMUSCULAR | Status: AC
  Administered 2024-05-22: 10 mg via INTRAVENOUS
  Filled 2024-05-22: qty 2

## 2024-05-22 NOTE — Progress Notes (Signed)
   05/22/24 1108  TOC Brief Assessment  Insurance and Status Reviewed  Patient has primary care physician Yes  Home environment has been reviewed alone  Prior level of function: independent  Prior/Current Home Services No current home services  Social Drivers of Health Review SDOH reviewed no interventions necessary  Readmission risk has been reviewed Yes  Transition of care needs no transition of care needs at this time

## 2024-05-22 NOTE — Plan of Care (Signed)
 ?  Problem: Clinical Measurements: ?Goal: Ability to maintain clinical measurements within normal limits will improve ?Outcome: Progressing ?Goal: Will remain free from infection ?Outcome: Progressing ?Goal: Diagnostic test results will improve ?Outcome: Progressing ?  ?

## 2024-05-22 NOTE — Progress Notes (Signed)
 [facility] Progress Note  Patient: Shelley [name]    FMW:[id number]  DOA: 05/20/2024     Date of Service: the patient was seen and examined on 05/22/2024  Chief Complaint  Patient presents with   Vomiting   Brief hospital course:  HONORE WIPPERFURTH is a 63 y.o. female with medical history significant of deafness, sleep apnea, alcohol abuse (has not had any alcohol since earlier this month), presents to the ED for symptoms of recurrent nausea and vomiting, upper abdominal discomfort.  Patient has been experiencing the symptoms for the past 2 weeks or so.  She has been seen in the ER for the symptoms on August 5 and again on August 11.  She had a CT scan of the abdomen and pelvis 2 weeks ago that did not show any acute process.  She was previously treated with IV fluids and nausea medications and was discharged home, however she returns for recurrent symptoms.  She has a history of hospitalization a couple years ago for similar symptoms. Her workup in the ER shows that her vital signs relatively stable.  Serum sodium is slightly low at 133.  Anion gap somewhat elevated at 22.  Rest of the CMP is relatively unremarkable.  Lipase is mildly elevated 82.  Hemoglobin is 18.2, WBC count is 5.7    Assessment and Plan:  # Diverticulitis CT scan shows sigmoid diverticulitis, no abscess or perforation Patient presented with intractable nausea and vomiting. 8/20 started ceftriaxone  and metronidazole  Increased PPI 40 mg IV twice daily Continue symptomatic treatment 8/21 Started full liquid diet, advance as per tolerance    # Hypokalemia, potassium repleted. # Hypomagnesemia, magnesium  repleted.   # Mild hyponatremia.  Likely related to nausea and vomiting.  Will repeat labs in the morning.   # Mildly elevated lipase.  Recent CT abdomen pelvis did not reveal any signs of pancreatic inflammation.  Will await results of the CT abdomen pelvis from today.   Lipase 82>>108 Trend lipase level    #  Alcohol abuse.  Patient reports not having any alcohol in the past couple of weeks.  Does not seem to have any signs of withdrawals.  # Hypertension, patient is not on any antihypertensive medication. High blood pressure possible secondary to pain Started hydralazine  as needed Monitor BP and titrate medication accordingly    Body mass index is 29.53 kg/m.  Interventions:  Diet: FLD DVT Prophylaxis: Subcutaneous Lovenox    Advance goals of care discussion: Full code  Family Communication: family was present at bedside, at the time of interview.  The pt provided permission to discuss medical plan with the family. Opportunity was given to ask question and all questions were answered satisfactorily.   Disposition:  Pt is from Home, admitted with diverticulitis, still has pain, CLD, IV antibiotics, which precludes a safe discharge. Discharge to home, when stable, most likely in few days.  Subjective: No significant events overnight.  States over the morning patient was little nauseous and had 2 episodes of vomiting small amount, no significant abdominal pain.  Patient is passing gas, no BM yet, she had a BM 2 days ago.   Physical Exam: General: NAD, lying comfortably Appear in no distress, affect appropriate Eyes: PERRLA ENT: Oral Mucosa Clear, moist  Neck: no JVD,  Cardiovascular: S1 and S2 Present, no Murmur,  Respiratory: good respiratory effort, Bilateral Air entry equal and Decreased, no Crackles, no wheezes Abdomen: BS present, Soft, mild LLQ tenderness Skin: no rashes Extremities: no Pedal edema, no  calf tenderness Neurologic: without any new focal findings Gait not checked due to patient safety concerns  Vitals:   05/21/24 1821 05/21/24 2041 05/22/24 0456 05/22/24 1324  BP: (!) 152/75 (!) 151/76 (!) 143/77 (!) 162/77  Pulse: (!) 46 (!) 44 (!) 48 (!) 46  Resp: 16 18 18 18   Temp: 98 F (36.7 C) 97.9 F (36.6 C) 98.3 F (36.8 C) (!) 97.5 F (36.4 C)  TempSrc: Oral  Oral Oral Oral  SpO2: 100% 98% 97% 100%  Weight:      Height:        Intake/Output Summary (Last 24 hours) at 05/22/2024 1409 Last data filed at 05/22/2024 1350 Gross per 24 hour  Intake 2946.68 ml  Output 100 ml  Net 2846.68 ml   Filed Weights   05/20/24 2349  Weight: 83 kg    Data Reviewed: I have personally reviewed and interpreted daily labs, tele strips, imagings as discussed above. I reviewed all nursing notes, pharmacy notes, vitals, pertinent old records I have discussed plan of care as described above with RN and patient/family.  CBC: Recent Labs  Lab 05/20/24 1740 05/21/24 0444 05/22/24 0459  WBC 5.7 4.7 3.5*  NEUTROABS 3.7  --   --   HGB 18.2* 16.5* 15.4*  HCT 54.5* 49.0* 47.6*  MCV 89.9 91.1 91.4  PLT 327 265 249   Basic Metabolic Panel: Recent Labs  Lab 05/20/24 1740 05/21/24 0444 05/22/24 0459  NA 133* 132* 134*  K 3.7 3.4* 3.2*  CL 94* 97* 100  CO2 17* 20* 23  GLUCOSE 94 81 86  BUN 9 8 <5*  CREATININE 0.89 0.71 0.61  CALCIUM 9.9 8.9 8.8*  MG  --  2.1 1.6*  PHOS  --  3.6 3.0    Studies: No results found.   Scheduled Meds:  enoxaparin  (LOVENOX ) injection  40 mg Subcutaneous Q24H   pantoprazole  (PROTONIX ) IV  40 mg Intravenous Q12H   Continuous Infusions:  cefTRIAXone  (ROCEPHIN )  IV 2 g (05/22/24 0940)   metronidazole  500 mg (05/22/24 1102)   potassium chloride  10 mEq (05/22/24 1355)   PRN Meds: hydrALAZINE , metoCLOPramide  (REGLAN ) injection, polyethylene glycol, traZODone   Time spent: 35 minutes  Author: ELVAN SOR. MD [facility] 05/22/2024 2:09 PM  To reach On-call, see care teams to locate the attending and reach out to them via [email or web address]. If 7PM-7AM, please contact night-coverage If you still have difficulty reaching the attending provider, please page the [facility] (Director on Call) for [facility] on amion for assistance.

## 2024-05-23 DIAGNOSIS — R112 Nausea with vomiting, unspecified: Secondary | ICD-10-CM | POA: Diagnosis not present

## 2024-05-23 LAB — BASIC METABOLIC PANEL WITH GFR
Anion gap: 12 (ref 5–15)
Anion gap: 16 — ABNORMAL HIGH (ref 5–15)
BUN: <5 mg/dL — ABNORMAL LOW (ref 8–23)
BUN: <5 mg/dL — ABNORMAL LOW (ref 8–23)
CO2: 23 mmol/L (ref 22–32)
CO2: 20 mmol/L — ABNORMAL LOW (ref 22–32)
Calcium: 8.8 mg/dL — ABNORMAL LOW (ref 8.9–10.3)
Calcium: 8.9 mg/dL (ref 8.9–10.3)
Chloride: 99 mmol/L (ref 98–111)
Chloride: 100 mmol/L (ref 98–111)
Creatinine, Ser: 0.63 mg/dL (ref 0.44–1.00)
Creatinine, Ser: 0.7 mg/dL (ref 0.44–1.00)
GFR, Estimated: >60 mL/min (ref >60)
GFR, Estimated: >60 mL/min (ref >60)
Glucose, Bld: 84 mg/dL (ref 70–99)
Glucose, Bld: 88 mg/dL (ref 70–99)
Potassium: 3.1 mmol/L — ABNORMAL LOW (ref 3.5–5.1)
Potassium: 3.6 mmol/L (ref 3.5–5.1)
Sodium: 134 mmol/L — ABNORMAL LOW (ref 135–145)
Sodium: 136 mmol/L (ref 135–145)

## 2024-05-23 LAB — LIPASE, BLOOD: Lipase: 91 U/L — ABNORMAL HIGH (ref 11–51)

## 2024-05-23 LAB — CBC
HCT: 47.7 % — ABNORMAL HIGH (ref 36.0–46.0)
Hemoglobin: 16.2 g/dL — ABNORMAL HIGH (ref 12.0–15.0)
MCH: 30.9 pg (ref 26.0–34.0)
MCHC: 34 g/dL (ref 30.0–36.0)
MCV: 91 fL (ref 80.0–100.0)
Platelets: 269 K/uL (ref 150–400)
RBC: 5.24 MIL/uL — ABNORMAL HIGH (ref 3.87–5.11)
RDW: 13.6 % (ref 11.5–15.5)
WBC: 5.5 K/uL (ref 4.0–10.5)
nRBC: 0 % (ref 0.0–0.2)

## 2024-05-23 LAB — PHOSPHORUS: Phosphorus: 3.1 mg/dL (ref 2.5–4.6)

## 2024-05-23 LAB — BRAIN NATRIURETIC PEPTIDE: B Natriuretic Peptide: 75.2 pg/mL (ref 0.0–100.0)

## 2024-05-23 LAB — RAPID URINE DRUG SCREEN, HOSP PERFORMED
Amphetamines: NOT DETECTED
Barbiturates: NOT DETECTED
Benzodiazepines: NOT DETECTED
Cocaine: NOT DETECTED
Opiates: NOT DETECTED
Tetrahydrocannabinol: NOT DETECTED

## 2024-05-23 LAB — GLUCOSE, CAPILLARY
Glucose-Capillary: 110 mg/dL — ABNORMAL HIGH (ref 70–99)
Glucose-Capillary: 94 mg/dL (ref 70–99)
Glucose-Capillary: 96 mg/dL (ref 70–99)

## 2024-05-23 LAB — TROPONIN I (HIGH SENSITIVITY)
Troponin I (High Sensitivity): 35 ng/L — ABNORMAL HIGH (ref <18)
Troponin I (High Sensitivity): 32 ng/L — ABNORMAL HIGH (ref <18)

## 2024-05-23 LAB — MAGNESIUM: Magnesium: 2 mg/dL (ref 1.7–2.4)

## 2024-05-23 MED ORDER — DIPHENHYDRAMINE HCL 50 MG/ML IJ SOLN
12.5000 mg | Freq: Once | INTRAMUSCULAR | Status: AC
  Administered 2024-05-23: 12.5 mg via INTRAVENOUS
  Filled 2024-05-23: qty 1

## 2024-05-23 MED ORDER — LORAZEPAM 2 MG/ML IJ SOLN
0.2500 mg | Freq: Four times a day (QID) | INTRAMUSCULAR | Status: AC | PRN
  Administered 2024-05-23: 0.25 mg via INTRAVENOUS
  Filled 2024-05-23: qty 1

## 2024-05-23 MED ORDER — POTASSIUM CHLORIDE 20 MEQ PO PACK: 40.0000 meq | PACK | Freq: Once | ORAL | Status: DC

## 2024-05-23 MED ORDER — PROCHLORPERAZINE EDISYLATE 10 MG/2ML IJ SOLN
INTRAMUSCULAR | Status: AC
  Administered 2024-05-23: 10 mg
  Filled 2024-05-23: qty 2

## 2024-05-23 MED ORDER — SODIUM CHLORIDE 0.9 % IV SOLN: INTRAVENOUS | Status: AC

## 2024-05-23 MED ORDER — POTASSIUM CHLORIDE 10 MEQ/100ML IV SOLN
10.0000 meq | INTRAVENOUS | Status: AC
  Administered 2024-05-23 (×4): 10 meq via INTRAVENOUS
  Filled 2024-05-23 (×4): qty 100

## 2024-05-23 NOTE — Progress Notes (Signed)
 Results of 12 lead EKG shared to MD

## 2024-05-23 NOTE — Progress Notes (Signed)
 MD notified of continued hyperemesis, green bile colored material in trash can and commode.

## 2024-05-23 NOTE — Progress Notes (Signed)
 [facility] Progress Note  Patient: Shelley [name]    FMW:[id number]  DOA: 05/20/2024     Date of Service: the patient was seen and examined on 05/23/2024  Chief Complaint  Patient presents with   Vomiting   Brief hospital course:  RORIE DELMORE is a 63 y.o. female with medical history significant of deafness, sleep apnea, alcohol abuse (has not had any alcohol since earlier this month), presents to the ED for symptoms of recurrent nausea and vomiting, upper abdominal discomfort.  Patient has been experiencing the symptoms for the past 2 weeks or so.  She has been seen in the ER for the symptoms on August 5 and again on August 11.  She had a CT scan of the abdomen and pelvis 2 weeks ago that did not show any acute process.  She was previously treated with IV fluids and nausea medications and was discharged home, however she returns for recurrent symptoms.  She has a history of hospitalization a couple years ago for similar symptoms. Her workup in the ER shows that her vital signs relatively stable.  Serum sodium is slightly low at 133.  Anion gap somewhat elevated at 22.  Rest of the CMP is relatively unremarkable.  Lipase is mildly elevated 82.  Hemoglobin is 18.2, WBC count is 5.7    Assessment and Plan:  # Diverticulitis CT scan shows sigmoid diverticulitis, no abscess or perforation Patient presented with intractable nausea and vomiting. 8/20 started ceftriaxone  and metronidazole  Increased PPI 40 mg IV twice daily Continue symptomatic treatment 8/21 Started full liquid diet, advance as per tolerance 8/22 multiple vomiting episodes, f/u repeat CT abdomen and pelvis   # Bradycardia and QT prolongation  EKG showed some T wave inversions, bradycardia and prolonged Qtc Replete electrolytes Continue to monitor on telemetry Follow repeat EKG Follow-up troponin and BNP Follow TTE Cardiology consulted for further recommendation   # Hypokalemia, potassium repleted. # Hypomagnesemia,  magnesium  repleted.   # Mild hyponatremia.  Likely related to nausea and vomiting.  Will repeat labs in the morning.   # Mildly elevated lipase.  Recent CT abdomen pelvis did not reveal any signs of pancreatic inflammation.  Will await results of the CT abdomen pelvis from today.   Lipase 82>>108 Trend lipase level    # Alcohol abuse.  Patient reports not having any alcohol in the past couple of weeks.  Does not seem to have any signs of withdrawals.  # Hypertension, patient is not on any antihypertensive medication. High blood pressure possible secondary to pain Started hydralazine  as needed Monitor BP and titrate medication accordingly    Body mass index is 29.53 kg/m.  Interventions:  Diet: FLD DVT Prophylaxis: Subcutaneous Lovenox    Advance goals of care discussion: Full code  Family Communication: family was present at bedside, at the time of interview.  The pt provided permission to discuss medical plan with the family. Opportunity was given to ask question and all questions were answered satisfactorily.   Disposition:  Pt is from Home, admitted with diverticulitis, still has pain, CLD, IV antibiotics, which precludes a safe discharge. Discharge to home, when stable, most likely in few days.  Subjective: No significant events overnight.  This morning patient is not feeling good, had multiple episodes of vomiting.    Physical Exam: General: NAD, lying comfortably Appear in no distress, affect appropriate Eyes: PERRLA ENT: Oral Mucosa Clear, moist  Neck: no JVD,  Cardiovascular: S1 and S2 Present, no Murmur, bradycardic Respiratory: good respiratory  effort, Bilateral Air entry equal and Decreased, no Crackles, no wheezes Abdomen: BS present, Soft, mild LLQ tenderness Skin: no rashes Extremities: no Pedal edema, no calf tenderness Neurologic: without any new focal findings Gait not checked due to patient safety concerns  Vitals:   05/22/24 1324 05/22/24 2111  05/23/24 0602 05/23/24 1342  BP: (!) 162/77 (!) 151/82 (!) 147/86 (!) 149/77  Pulse: (!) 46 (!) 50 (!) 47 (!) 51  Resp: 18 16 16 18   Temp: (!) 97.5 F (36.4 C) 98.4 F (36.9 C) 97.9 F (36.6 C) 98 F (36.7 C)  TempSrc: Oral Oral Oral Oral  SpO2: 100% 99% 100% 98%  Weight:      Height:        Intake/Output Summary (Last 24 hours) at 05/23/2024 1536 Last data filed at 05/23/2024 1342 Gross per 24 hour  Intake 897.17 ml  Output 50 ml  Net 847.17 ml   Filed Weights   05/20/24 2349  Weight: 83 kg    Data Reviewed: I have personally reviewed and interpreted daily labs, tele strips, imagings as discussed above. I reviewed all nursing notes, pharmacy notes, vitals, pertinent old records I have discussed plan of care as described above with RN and patient/family.  CBC: Recent Labs  Lab 05/20/24 1740 05/21/24 0444 05/22/24 0459 05/23/24 0441  WBC 5.7 4.7 3.5* 5.5  NEUTROABS 3.7  --   --   --   HGB 18.2* 16.5* 15.4* 16.2*  HCT 54.5* 49.0* 47.6* 47.7*  MCV 89.9 91.1 91.4 91.0  PLT 327 265 249 269   Basic Metabolic Panel: Recent Labs  Lab 05/20/24 1740 05/21/24 0444 05/22/24 0459 05/23/24 0441  NA 133* 132* 134* 134*  K 3.7 3.4* 3.2* 3.1*  CL 94* 97* 100 99  CO2 17* 20* 23 23  GLUCOSE 94 81 86 84  BUN 9 8 <5* <5*  CREATININE 0.89 0.71 0.61 0.63  CALCIUM 9.9 8.9 8.8* 8.8*  MG  --  2.1 1.6* 2.0  PHOS  --  3.6 3.0 3.1    Studies: No results found.   Scheduled Meds:  enoxaparin  (LOVENOX ) injection  40 mg Subcutaneous Q24H   pantoprazole  (PROTONIX ) IV  40 mg Intravenous Q12H   potassium chloride   40 mEq Oral Once   Continuous Infusions:  cefTRIAXone  (ROCEPHIN )  IV 2 g (05/23/24 0936)   metronidazole  500 mg (05/23/24 0945)   PRN Meds: hydrALAZINE , metoCLOPramide  (REGLAN ) injection, polyethylene glycol, traZODone   Time spent: 55 minutes  Author: ELVAN SOR. MD [facility] 05/23/2024 3:36 PM  To reach On-call, see care teams to locate the  attending and reach out to them via [email or web address]. If 7PM-7AM, please contact night-coverage If you still have difficulty reaching the attending provider, please page the [facility] (Director on Call) for [facility] on amion for assistance.

## 2024-05-23 NOTE — Plan of Care (Signed)

## 2024-05-23 NOTE — Progress Notes (Addendum)
 8/21 Overnight note:   Patient had dry heaving episode @ 2052, small bout of n/v. Emesis clear, greenish but too small to properly measure in emesis bag.   Messaged Night on-call HCP - received a new limited-duration order for PRN (Ativan ).   Patient says she's significantly not nauseous after taking Ativan , had bowel movement overnight also.

## 2024-05-24 ENCOUNTER — Inpatient Hospital Stay (HOSPITAL_COMMUNITY)

## 2024-05-24 DIAGNOSIS — K5792 Diverticulitis of intestine, part unspecified, without perforation or abscess without bleeding: Secondary | ICD-10-CM | POA: Diagnosis not present

## 2024-05-24 DIAGNOSIS — R9431 Abnormal electrocardiogram [ECG] [EKG]: Secondary | ICD-10-CM

## 2024-05-24 DIAGNOSIS — R112 Nausea with vomiting, unspecified: Secondary | ICD-10-CM | POA: Diagnosis not present

## 2024-05-24 LAB — HEPATIC FUNCTION PANEL
ALT: 16 U/L (ref 0–44)
AST: 19 U/L (ref 15–41)
Albumin: 2.9 g/dL — ABNORMAL LOW (ref 3.5–5.0)
Alkaline Phosphatase: 54 U/L (ref 38–126)
Bilirubin, Direct: 0.1 mg/dL (ref 0.0–0.2)
Indirect Bilirubin: 0.8 mg/dL (ref 0.3–0.9)
Total Bilirubin: 0.9 mg/dL (ref 0.0–1.2)
Total Protein: 5.6 g/dL — ABNORMAL LOW (ref 6.5–8.1)

## 2024-05-24 LAB — ECHOCARDIOGRAM COMPLETE
AR max vel: 2.03 cm2
AV Area VTI: 1.69 cm2
AV Area mean vel: 1.83 cm2
AV Mean grad: 4 mmHg
AV Peak grad: 7 mmHg
Ao pk vel: 1.32 m/s
Area-P 1/2: 2.72 cm2
Est EF: 55
Height: 66 in
S' Lateral: 2.8 cm
Weight: 2927.71 [oz_av]

## 2024-05-24 LAB — GLUCOSE, CAPILLARY
Glucose-Capillary: 88 mg/dL (ref 70–99)
Glucose-Capillary: 88 mg/dL (ref 70–99)

## 2024-05-24 LAB — LIPASE, BLOOD: Lipase: 124 U/L — ABNORMAL HIGH (ref 11–51)

## 2024-05-24 LAB — CBC
HCT: 47 % — ABNORMAL HIGH (ref 36.0–46.0)
Hemoglobin: 15.2 g/dL — ABNORMAL HIGH (ref 12.0–15.0)
MCH: 29.9 pg (ref 26.0–34.0)
MCHC: 32.3 g/dL (ref 30.0–36.0)
MCV: 92.3 fL (ref 80.0–100.0)
Platelets: 253 K/uL (ref 150–400)
RBC: 5.09 MIL/uL (ref 3.87–5.11)
RDW: 13.5 % (ref 11.5–15.5)
WBC: 4.9 K/uL (ref 4.0–10.5)
nRBC: 0 % (ref 0.0–0.2)

## 2024-05-24 LAB — BASIC METABOLIC PANEL WITH GFR
Anion gap: 12 (ref 5–15)
BUN: <5 mg/dL — ABNORMAL LOW (ref 8–23)
CO2: 22 mmol/L (ref 22–32)
Calcium: 8.7 mg/dL — ABNORMAL LOW (ref 8.9–10.3)
Chloride: 102 mmol/L (ref 98–111)
Creatinine, Ser: 0.72 mg/dL (ref 0.44–1.00)
GFR, Estimated: >60 mL/min (ref >60)
Glucose, Bld: 80 mg/dL (ref 70–99)
Potassium: 3.7 mmol/L (ref 3.5–5.1)
Sodium: 136 mmol/L (ref 135–145)

## 2024-05-24 LAB — PHOSPHORUS: Phosphorus: 2.9 mg/dL (ref 2.5–4.6)

## 2024-05-24 LAB — MAGNESIUM: Magnesium: 1.8 mg/dL (ref 1.7–2.4)

## 2024-05-24 MED ORDER — AMLODIPINE BESYLATE 10 MG PO TABS
10.0000 mg | ORAL_TABLET | Freq: Every day | ORAL | Status: DC
  Administered 2024-05-24: 10 mg via ORAL
  Filled 2024-05-24: qty 1

## 2024-05-24 MED ORDER — SCOPOLAMINE 1 MG/3DAYS TD PT72
1.0000 | MEDICATED_PATCH | TRANSDERMAL | Status: DC
  Administered 2024-05-24 – 2024-05-30 (×3): 1 mg via TRANSDERMAL
  Filled 2024-05-24 (×3): qty 1

## 2024-05-24 MED ORDER — SODIUM CHLORIDE 0.9 % IV SOLN
12.5000 mg | Freq: Four times a day (QID) | INTRAVENOUS | Status: DC | PRN
  Administered 2024-05-24: 12.5 mg via INTRAVENOUS
  Filled 2024-05-24: qty 0.5
  Filled 2024-05-24: qty 12.5

## 2024-05-24 MED ORDER — MAGNESIUM SULFATE 2 GM/50ML IV SOLN
2.0000 g | Freq: Once | INTRAVENOUS | Status: AC
  Administered 2024-05-24: 2 g via INTRAVENOUS
  Filled 2024-05-24: qty 50

## 2024-05-24 MED ORDER — LOSARTAN POTASSIUM 25 MG PO TABS
25.0000 mg | ORAL_TABLET | Freq: Every day | ORAL | Status: DC
  Administered 2024-05-24 – 2024-05-31 (×8): 25 mg via ORAL
  Filled 2024-05-24 (×8): qty 1

## 2024-05-24 MED ORDER — IOHEXOL 9 MG/ML PO SOLN
500.0000 mL | ORAL | Status: AC
  Administered 2024-05-24: 500 mL via ORAL

## 2024-05-24 MED ORDER — IOHEXOL 300 MG/ML  SOLN
100.0000 mL | Freq: Once | INTRAMUSCULAR | Status: AC | PRN
  Administered 2024-05-24: 100 mL via INTRAVENOUS

## 2024-05-24 NOTE — Progress Notes (Signed)
 Report given to Loyce, RN at [facility]. Awaiting [facility] to come and transport to [facility]. Cardiologist and Hospitalist , because of abnormal EKGs, recommended transfer this am. Pt stable with no complaints except nausea and vomiting as for reason for admission. Reassured pt and family.

## 2024-05-24 NOTE — Plan of Care (Signed)
  Problem: Education: Goal: Knowledge of General Education information will improve Description: Including pain rating scale, medication(s)/side effects and non-pharmacologic comfort measures Outcome: Progressing   Problem: Health Behavior/Discharge Planning: Goal: Ability to manage health-related needs will improve Outcome: Progressing   Problem: Clinical Measurements: Goal: Diagnostic test results will improve Outcome: Progressing Goal: Respiratory complications will improve Outcome: Progressing Goal: Cardiovascular complication will be avoided Outcome: Progressing   Problem: Activity: Goal: Risk for activity intolerance will decrease Outcome: Progressing   Problem: Nutrition: Goal: Adequate nutrition will be maintained Outcome: Progressing   Problem: Coping: Goal: Level of anxiety will decrease Outcome: Progressing   Problem: Elimination: Goal: Will not experience complications related to bowel motility Outcome: Progressing Goal: Will not experience complications related to urinary retention Outcome: Progressing   Problem: Pain Managment: Goal: General experience of comfort will improve and/or be controlled Outcome: Progressing   Problem: Safety: Goal: Ability to remain free from injury will improve Outcome: Progressing   Problem: Skin Integrity: Goal: Risk for impaired skin integrity will decrease Outcome: Progressing

## 2024-05-24 NOTE — Progress Notes (Signed)
 [facility] arrived to transport pt to [facility]. No c/o this am yet pt c/o chest pain to [facility] staff who did a 12 lead EKG. Results similar to previous EKG 8/22. No distress. Promptly transported to [facility].

## 2024-05-24 NOTE — Progress Notes (Signed)
 PROGRESS NOTE  Shelley [name]  FMW:[id number] DOB: [birth year] DOA: 05/20/2024 PCP: Ilah Crigler, MD   Brief Narrative: Patient is a 63 year old female with history of deafness, sleep apnea, alcohol abuse who presented to the emergency room with complaint of nausea, vomiting, abdominal discomfort for 2 weeks.  Had multiple ER visits for the same recently.  On presentation, CT abdomen/pelvis showed acute sigmoid diverticulitis with increasing inflammatory stranding, started on antibiotics for diverticulitis.  Cultures have not been sent.  Hospital course remarkable for intractable nausea and vomiting.  Planning for repeat CT abdomen/pelvis today.  Her EKG also showed QT prolongation, bradycardia, ST changes so cardiology consulted.  Due to her EKG changes, cardiology recommended to transfer to [facility].  Assessment & Plan:  Principal Problem:   Intractable nausea and vomiting Active Problems:   Deafness   Alcohol abuse   Intractable vomiting   Diverticulitis of sigmoid colon   Acute sigmoid diverticulitis: Presented with nausea, vomiting, abdominal discomfort.  CT showed acute sigmoid diverticulitis without perforation.  She was on full liquid diet.   On ceftriaxone , flagyl . She continued to complain of nausea, vomiting, upper abdominal discomfort ,follow-up CT abdomen/pelvis pending.  Abdomen is not that tender on today's examination.  Has good bowel sounds. Reglan  discontinued due to prolonged QTc.  Bradycardia/QT prolongation: EKG showed some ST changes, T wave inversions, prolonged QTc.  No chest pain.  Continue to monitor electrolytes.  Echo has been ordered, result pending.  Monitor on telemetry.  Cardiology consulted and following.  Recommended to transfer to [facility] due to her extensive EKG changes.  Troponins mildly elevated with flat trend, she denies any chest pain but complains of upper abdominal discomfort  Hypokalemia/hypomagnesemia: Currently being monitored and supplemented.  Elevated  lipase: CT abdomen/pelvis did not show any signs of acute inflammation.  Follow-up repeat CT.  Chronic alcohol abuse: Currently not in withdrawal.  Continue thiamine  and folic acid   Hypertension: Not on antihypertensive at home.  Trying to avoid nodal blocking agents due to bradycardia.  Will start her on losartan         DVT prophylaxis:enoxaparin  (LOVENOX ) injection 40 mg Start: 05/21/24 1000 Place TED hose Start: 05/20/24 2223     Code Status: Full Code  Family Communication: Called and discussed with sister Shelley [name] on phone on 8/23  Patient status:Inpatient  Patient is from :Home  Anticipated discharge un:[facility]  Estimated DC date:2-3 days   Consultants: Cardiology  Procedures:None  Antimicrobials:  Anti-infectives (From admission, onward)    Start     Dose/Rate Route Frequency Ordered Stop   05/21/24 1000  metroNIDAZOLE  (FLAGYL ) IVPB 500 mg        500 mg 100 mL/hr over 60 Minutes Intravenous Every 12 hours 05/21/24 0832     05/21/24 1000  cefTRIAXone  (ROCEPHIN ) 2 g in sodium chloride  0.9 % 100 mL IVPB        2 g 200 mL/hr over 30 Minutes Intravenous Every 24 hours 05/21/24 0842     05/21/24 0930  ciprofloxacin  (CIPRO ) IVPB 400 mg  Status:  Discontinued        400 mg 200 mL/hr over 60 Minutes Intravenous Every 12 hours 05/21/24 9167 05/21/24 0841       Subjective: Patient seen and examined at bedside today.  Communicated with the patient by writing on a notebook.  She looked overall comfortable.  During my evaluation, her nausea and vomiting has improved this morning.  She was not stretching or nauseous .  Denied any chest pain but says she  has discomfort on the upper abdomen.  Examination revealed good bowel sounds.  Abdomen is not tender or distended  Objective: Vitals:   05/23/24 1821 05/23/24 2043 05/24/24 0218 05/24/24 0608  BP: (!) 167/73 (!) 156/80 (!) 145/84 (!) 158/82  Pulse: (!) 48 (!) 49 (!) 49   Resp: 16 16 17 18   Temp: 98.1 F (36.7 C) 98.2 F  (36.8 C) 97.9 F (36.6 C) 98.3 F (36.8 C)  TempSrc: Oral Oral Oral Oral  SpO2: 97% 97% 99% 98%  Weight:      Height:        Intake/Output Summary (Last 24 hours) at 05/24/2024 0757 Last data filed at 05/24/2024 0600 Gross per 24 hour  Intake 1240.13 ml  Output 400 ml  Net 840.13 ml   Filed Weights   05/20/24 2349  Weight: 83 kg    Examination:  General exam: Overall comfortable, not in distress, deaf HEENT: PERRL Respiratory system:  no wheezes or crackles  Cardiovascular system: S1 & S2 heard, RRR.  Gastrointestinal system: Abdomen is nondistended, soft and may be  mildly tender in the epigastrium but no frank tenderness Central nervous system: Alert and oriented Extremities: No edema, no clubbing ,no cyanosis Skin: No rashes, no ulcers,no icterus     Data Reviewed: I have personally reviewed following labs and imaging studies  CBC: Recent Labs  Lab 05/20/24 1740 05/21/24 0444 05/22/24 0459 05/23/24 0441 05/24/24 0546  WBC 5.7 4.7 3.5* 5.5 4.9  NEUTROABS 3.7  --   --   --   --   HGB 18.2* 16.5* 15.4* 16.2* 15.2*  HCT 54.5* 49.0* 47.6* 47.7* 47.0*  MCV 89.9 91.1 91.4 91.0 92.3  PLT 327 265 249 269 253   Basic Metabolic Panel: Recent Labs  Lab 05/21/24 0444 05/22/24 0459 05/23/24 0441 05/23/24 1722 05/24/24 0546  NA 132* 134* 134* 136 136  K 3.4* 3.2* 3.1* 3.6 3.7  CL 97* 100 99 100 102  CO2 20* 23 23 20* 22  GLUCOSE 81 86 84 88 80  BUN 8 <5* <5* <5* <5*  CREATININE 0.71 0.61 0.63 0.70 0.72  CALCIUM 8.9 8.8* 8.8* 8.9 8.7*  MG 2.1 1.6* 2.0  --  1.8  PHOS 3.6 3.0 3.1  --  2.9     No results found for this or any previous visit (from the past 240 hours).   Radiology Studies: No results found.  Scheduled Meds:  enoxaparin  (LOVENOX ) injection  40 mg Subcutaneous Q24H   pantoprazole  (PROTONIX ) IV  40 mg Intravenous Q12H   Continuous Infusions:  sodium chloride  75 mL/hr at 05/23/24 1909   cefTRIAXone  (ROCEPHIN )  IV 2 g (05/23/24 0936)    metronidazole  500 mg (05/23/24 2232)     LOS: 3 days   Ivonne Mustache, MD [facility] P8/23/2025, 7:57 AM

## 2024-05-24 NOTE — Progress Notes (Signed)
 Patient continues to have nausea and vomiting.  QTC > 500 on tele. Mg 1.8.  Noted cards recs and reached out to [facility] with new orders.  Scopolamine  patch applied behind right ear and IV magnesium  infusing now.

## 2024-05-24 NOTE — Consult Note (Signed)
 CARDIOLOGY CONSULT NOTE       Patient ID: Shelley [name] MRN: [id number] DOB/AGE: 1961-05-31 63 y.o.  Admit date: 05/20/2024 Referring Physician: [name] Primary Physician: Shelley Crigler, MD Primary Cardiologist: None Reason for Consultation: Abnormal ECG  Principal Problem:   Intractable nausea and vomiting Active Problems:   Abnormal ECG   Deafness   Alcohol abuse   Intractable vomiting   Diverticulitis of sigmoid colon   HPI:  63 y.o. admitted with nausea and vomiting. Asked to see for abnormal ECG. She is deaf and interview don with hand written notes. She has diagnosis of thyroid  dx but no recent TSH in chart  No prior cardiac dx. OSA uses CPAP and HTN. Not on AV nodal drugs. Review of ECG show bradycardia HR 45-55 bpm. Long QT and T wave inversions in inferior lateral leads. She has no chest pain troponins negative. Echo just done this am reviewed and EF normal 55% no valve dx and no effusion. She is being Rx with Rocephin  and metronidazole  for diverticulitis seen on CT Lipase elevated 124. K 3.7 and Mg 1.8   ROS All other systems reviewed and negative except as noted above  Past Medical History:  Diagnosis Date   Allergy    Deaf    OSA on CPAP 10/03/2011   Thyroid  disease     Family History  Problem Relation Age of Onset   Arthritis Mother    Diabetes Mother    Heart disease Father    Hyperlipidemia Father    Hypertension Father    Stroke Father    Alcohol abuse Neg Hx    Cancer Neg Hx    Early death Neg Hx    Kidney disease Neg Hx    Breast cancer Neg Hx     Social History   Socioeconomic History   Marital status: Single    Spouse name: Not on file   Number of children: Not on file   Years of education: Not on file   Highest education level: Not on file  Occupational History   Not on file  Tobacco Use   Smoking status: Every Day    Types: Cigarettes   Smokeless tobacco: Never  Vaping Use   Vaping status: Never Used  Substance and Sexual Activity    Alcohol use: No   Drug use: No   Sexual activity: Never  Other Topics Concern   Not on file  Social History Narrative   Not on file   Social Drivers of Health   Financial Resource Strain: Not on file  Food Insecurity: No Food Insecurity (05/21/2024)   Hunger Vital Sign    Worried About Running Out of Food in the Last Year: Never true    Ran Out of Food in the Last Year: Never true  Transportation Needs: No Transportation Needs (05/21/2024)   PRAPARE - Administrator, Civil Service (Medical): No    Lack of Transportation (Non-Medical): No  Physical Activity: Not on file  Stress: Not on file  Social Connections: Not on file  Intimate Partner Violence: Not At Risk (05/21/2024)   Humiliation, Afraid, Rape, and Kick questionnaire    Fear of Current or Ex-Partner: No    Emotionally Abused: No    Physically Abused: No    Sexually Abused: No    History reviewed. No pertinent surgical history.    Current Facility-Administered Medications:    0.9 %  sodium chloride  infusion, , Intravenous, Continuous, Shelley Bellis, MD, Last Rate: 75 mL/hr  at 05/23/24 1909, New Bag at 05/23/24 1909   amLODipine  (NORVASC ) tablet 10 mg, 10 mg, Oral, Daily, Shelley [name], Amrit, MD   cefTRIAXone  (ROCEPHIN ) 2 g in sodium chloride  0.9 % 100 mL IVPB, 2 g, Intravenous, Q24H, Shelley Bellis, MD, Last Rate: 200 mL/hr at 05/23/24 0936, 2 g at 05/23/24 9063   enoxaparin  (LOVENOX ) injection 40 mg, 40 mg, Subcutaneous, Q24H, Shelley [name], Shelley A, MD, 40 mg at 05/23/24 0932   hydrALAZINE  (APRESOLINE ) injection 10 mg, 10 mg, Intravenous, Q6H PRN, Shelley [name], Shelley A, MD   iohexol  (OMNIPAQUE ) 9 MG/ML oral solution 500 mL, 500 mL, Oral, Q1H, Shelley Bellis, MD   metoCLOPramide  (REGLAN ) injection 5 mg, 5 mg, Intravenous, Q6H PRN, Shelley [name], Shelley A, MD, 5 mg at 05/23/24 1126   metroNIDAZOLE  (FLAGYL ) IVPB 500 mg, 500 mg, Intravenous, Q12H, Shelley Bellis, MD, Last Rate: 100 mL/hr at 05/23/24 2232, 500 mg at 05/23/24 2232    pantoprazole  (PROTONIX ) injection 40 mg, 40 mg, Intravenous, Q12H, Shelley Bellis, MD, 40 mg at 05/23/24 2222   polyethylene glycol (MIRALAX  / GLYCOLAX ) packet 17 g, 17 g, Oral, Daily PRN, Shelley [name], Shelley A, MD, 17 g at 05/22/24 9187   traZODone  (DESYREL ) tablet 50 mg, 50 mg, Oral, QHS PRN, Shelley Bellis, MD  amLODipine   10 mg Oral Daily   enoxaparin  (LOVENOX ) injection  40 mg Subcutaneous Q24H   iohexol   500 mL Oral Q1H   pantoprazole  (PROTONIX ) IV  40 mg Intravenous Q12H    sodium chloride  75 mL/hr at 05/23/24 1909   cefTRIAXone  (ROCEPHIN )  IV 2 g (05/23/24 0936)   metronidazole  500 mg (05/23/24 2232)    Physical Exam: Blood pressure (!) 158/82, pulse (!) 49, temperature 98.3 F (36.8 C), temperature source Oral, resp. rate 18, height 5' 6 (1.676 m), weight 83 kg, SpO2 98%.   Deaf black female No murmur Lungs clear Tender to palpation lower mid abdomen No rebound No edema  Labs:   Lab Results  Component Value Date   WBC 4.9 05/24/2024   HGB 15.2 (H) 05/24/2024   HCT 47.0 (H) 05/24/2024   MCV 92.3 05/24/2024   PLT 253 05/24/2024    Recent Labs  Lab 05/24/24 0546  NA 136  K 3.7  CL 102  CO2 22  BUN <5*  CREATININE 0.72  CALCIUM 8.7*  PROT 5.6*  BILITOT 0.9  ALKPHOS 54  ALT 16  AST 19  GLUCOSE 80   No results found for: CKTOTAL, CKMB, CKMBINDEX, TROPONINI  Lab Results  Component Value Date   CHOL 217 (H) 09/12/2012   Lab Results  Component Value Date   HDL 42.40 09/12/2012   No results found for: [facility] Lab Results  Component Value Date   TRIG 113.0 09/12/2012   Lab Results  Component Value Date   CHOLHDL 5 09/12/2012   Lab Results  Component Value Date   LDLDIRECT 157.1 09/12/2012      Radiology: CT ABDOMEN PELVIS W CONTRAST Result Date: 05/21/2024 CLINICAL DATA:  Intractable vomiting for the past few weeks, seen for the same issue here 05/06/2024. EXAM: CT ABDOMEN AND PELVIS WITH CONTRAST TECHNIQUE: Multidetector CT imaging of the  abdomen and pelvis was performed using the standard protocol following bolus administration of intravenous contrast. RADIATION DOSE REDUCTION: This exam was performed according to the departmental dose-optimization program which includes automated exposure control, adjustment of the mA and/or kV according to patient size and/or use of iterative reconstruction technique. CONTRAST:  OMNIPAQUE  IOHEXOL  300 MG/ML  SOLN COMPARISON:  CTs with  IV contrast 05/06/2024 and 01/06/2022. FINDINGS: Lower chest: No acute abnormality.  Mild cardiomegaly. Hepatobiliary: Respiratory motion limits fine detail. No liver masses seen through the breathing motion. The liver is slightly steatotic. There is focal periligamentous fat in segment 4 B. There are rounded noncalcified structures in the gallbladder consistent with either noncalcified stones or tumefactive sludge. The largest of these is 2.8 cm. This was seen previously. No biliary dilatation or wall thickening noted. Pancreas: No abnormality. Spleen: No abnormality. Adrenals/Urinary Tract: There is no adrenal mass. There is [name] 1.4 cm Bosniak 1 cyst in the upper pole left kidney, Hounsfield density is 0.1. No follow-up imaging recommended. There is no renal mass enhancement, calculus or obstruction. There is symmetric renal excretion on the delayed images. The bladder is unremarkable for the degree of distension. Stomach/Bowel: Unremarkable contracted stomach. Normal caliber small bowel without inflammatory changes. Normal appendix. There is diffuse colonic diverticulosis. The proximal 8 cm of the sigmoid colon again demonstrates disproportionate wall thickening and increasing inflammatory stranding compared with August 5, findings consistent with acute sigmoid diverticulitis. [name] follow-up colonoscopy is recommended after treatment to exclude underlying lesion given the degree of wall thickening. Rest of the colon wall has [name] normal thickness. No further inflammation.  Vascular/Lymphatic: Aortic atherosclerosis. No enlarged abdominal or pelvic lymph nodes. Reproductive: There is [name] stable, simple appearing thin walled left ovarian cyst which is unchanged back to 01/06/2022. The right ovary is unremarkable. The uterus is unremarkable. No adnexal mass is seen. Other: Small umbilical fat hernia. No incarcerated hernia. No free fluid, free hemorrhage, or free air including associated with diverticulitis. Musculoskeletal: Mild osteopenia and degenerative change lumbar spine. 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 No acute or other significant osseous findings are seen. IMPRESSION: 1. Acute sigmoid diverticulitis with increasing inflammatory stranding compared with 05/06/2024. [name] follow-up colonoscopy is recommended after treatment to exclude underlying lesion given the degree of wall thickening. 2. No bowel obstruction or perforation. 3. Aortic atherosclerosis. 4. Mild cardiomegaly. 5. Mild hepatic steatosis. 6. Noncalcified stones versus tumefactive sludge in the gallbladder. No biliary dilatation. 7. Stable simple appearing left ovarian cyst. No follow-up imaging recommended. 8. Umbilical fat hernia. 9. Osteopenia and degenerative change. Ankylosis over the inferior SI joints. Aortic Atherosclerosis (ICD10-I70.0). Electronically Signed   By: Francis Quam M.D.   On: 05/21/2024 00:01   CT ABDOMEN PELVIS W CONTRAST Result Date: 05/06/2024 CLINICAL DATA:  Acute abdominal pain and nausea. EXAM: CT ABDOMEN AND PELVIS WITH CONTRAST TECHNIQUE: Multidetector CT imaging of the abdomen and pelvis was performed using the standard protocol following bolus administration of intravenous contrast. RADIATION DOSE REDUCTION: This exam was performed according to the departmental dose-optimization program which includes automated exposure control, adjustment of the mA and/or kV according to patient size and/or use of iterative reconstruction technique. CONTRAST:  OMNIPAQUE  IOHEXOL  300  MG/ML  SOLN COMPARISON:  01/06/2022 FINDINGS: Lower Chest: No acute findings. Hepatobiliary: Geographic attic steatosis is nearly completely resolved since previous study. No suspicious hepatic masses identified. Gallbladder is unremarkable. No evidence of biliary ductal dilatation. Pancreas:  No mass or inflammatory changes. Spleen: Within normal limits in size and appearance. Adrenals/Urinary Tract: No suspicious masses identified. No evidence of ureteral calculi or hydronephrosis. Stomach/Bowel: No evidence of obstruction, inflammatory process or abnormal fluid collections. Diffuse colonic diverticulosis is again seen, without signs of diverticulitis. Vascular/Lymphatic: No pathologically enlarged lymph nodes. No acute vascular findings. Reproductive: 2.1 cm benign-appearing left ovarian cyst, without significant change, and consistent with benign etiology. No evidence  of inflammatory process or free fluid. Other:  None. Musculoskeletal:  No suspicious bone lesions identified. IMPRESSION: No acute findings within the abdomen or pelvis. Decreased hepatic steatosis. Colonic diverticulosis, without radiographic evidence of diverticulitis. Stable small benign-appearing left ovarian cyst. Electronically Signed   By: Norleen DELENA Kil M.D.   On: 05/06/2024 13:14    EKG: SB rate rate 51 QT 535 Deep T wave inversion inferior lateral leads   ASSESSMENT AND PLAN:   Abnormal ECG:  Doubt ischemic changes. Likely vagal inputs from abdominal pain. D/c Reglan  consider scopolamine , dronabinol, or dexamethazone for nausea. Continue telemetry TTE with no RWMA;s and normal EF. Supplement K > 4 and Mg 2 > She is asymptomatic from heart perspective but would transfer to medical service at [facility] as we have no ability to respond to worsening bradycardia or torsades at [facility] latter in day or night  Diverticulitis:  continue antibiotics seems comfortable this am advance diet as tolerated  Thyroid  dx - check TSH/T4  Signed: Maude Emmer 05/24/2024, 8:55 AM

## 2024-05-25 DIAGNOSIS — I10 Essential (primary) hypertension: Secondary | ICD-10-CM

## 2024-05-25 DIAGNOSIS — K5792 Diverticulitis of intestine, part unspecified, without perforation or abscess without bleeding: Secondary | ICD-10-CM | POA: Diagnosis not present

## 2024-05-25 DIAGNOSIS — R9431 Abnormal electrocardiogram [ECG] [EKG]: Secondary | ICD-10-CM | POA: Diagnosis not present

## 2024-05-25 LAB — CBC
HCT: 48.2 % — ABNORMAL HIGH (ref 36.0–46.0)
Hemoglobin: 16.5 g/dL — ABNORMAL HIGH (ref 12.0–15.0)
MCH: 30.3 pg (ref 26.0–34.0)
MCHC: 34.2 g/dL (ref 30.0–36.0)
MCV: 88.6 fL (ref 80.0–100.0)
Platelets: 278 K/uL (ref 150–400)
RBC: 5.44 MIL/uL — ABNORMAL HIGH (ref 3.87–5.11)
RDW: 13.6 % (ref 11.5–15.5)
WBC: 5.3 K/uL (ref 4.0–10.5)
nRBC: 0 % (ref 0.0–0.2)

## 2024-05-25 LAB — BASIC METABOLIC PANEL WITH GFR
Anion gap: 13 (ref 5–15)
BUN: <5 mg/dL — ABNORMAL LOW (ref 8–23)
CO2: 21 mmol/L — ABNORMAL LOW (ref 22–32)
Calcium: 8.8 mg/dL — ABNORMAL LOW (ref 8.9–10.3)
Chloride: 98 mmol/L (ref 98–111)
Creatinine, Ser: 0.85 mg/dL (ref 0.44–1.00)
GFR, Estimated: >60 mL/min (ref >60)
Glucose, Bld: 78 mg/dL (ref 70–99)
Potassium: 3.2 mmol/L — ABNORMAL LOW (ref 3.5–5.1)
Sodium: 132 mmol/L — ABNORMAL LOW (ref 135–145)

## 2024-05-25 LAB — LIPASE, BLOOD: Lipase: 126 U/L — ABNORMAL HIGH (ref 11–51)

## 2024-05-25 LAB — T4, FREE: Free T4: 1.06 ng/dL (ref 0.61–1.12)

## 2024-05-25 LAB — TSH: TSH: 0.692 u[IU]/mL (ref 0.350–4.500)

## 2024-05-25 MED ORDER — POTASSIUM CHLORIDE CRYS ER 20 MEQ PO TBCR: 40.0000 meq | EXTENDED_RELEASE_TABLET | Freq: Two times a day (BID) | ORAL | Status: DC

## 2024-05-25 MED ORDER — LACTATED RINGERS IV SOLN: Freq: Once | INTRAVENOUS | Status: AC

## 2024-05-25 MED ORDER — PALONOSETRON HCL INJECTION 0.25 MG/5ML: 0.2500 mg | Freq: Once | INTRAVENOUS | Status: DC

## 2024-05-25 MED ORDER — ORAL CARE MOUTH RINSE
15.0000 mL | OROMUCOSAL | Status: DC | PRN
  Administered 2024-05-25: 15 mL via OROMUCOSAL

## 2024-05-25 MED ORDER — OLANZAPINE 5 MG PO TBDP
5.0000 mg | ORAL_TABLET | Freq: Every day | ORAL | Status: DC
  Administered 2024-05-25 – 2024-05-31 (×7): 5 mg via ORAL
  Filled 2024-05-25 (×7): qty 1

## 2024-05-25 MED ORDER — POTASSIUM CHLORIDE 10 MEQ/100ML IV SOLN
10.0000 meq | INTRAVENOUS | Status: AC
  Administered 2024-05-25 (×6): 10 meq via INTRAVENOUS
  Filled 2024-05-25 (×6): qty 100

## 2024-05-25 NOTE — Plan of Care (Signed)

## 2024-05-25 NOTE — Progress Notes (Signed)
 PROGRESS NOTE    Shelley [name]  FMW:[id number] DOB: 1960/11/24 DOA: 05/20/2024 PCP: Ilah Crigler, MD   Brief Narrative:  Patient is a 63 year old female with history of deafness, sleep apnea, alcohol abuse who presented to the emergency room with complaint of nausea, vomiting, abdominal discomfort for 2 weeks.  Had multiple ER visits for the same recently.  On presentation, CT abdomen/pelvis showed acute sigmoid diverticulitis with increasing inflammatory stranding, started on antibiotics for diverticulitis.  Cultures have not been sent.  Hospital course remarkable for intractable nausea and vomiting.  Planning for repeat CT abdomen/pelvis today.  Her EKG also showed QT prolongation, bradycardia, ST changes so cardiology consulted.  Due to her EKG changes, cardiology recommended to transfer to [facility].  Assessment & Plan:   Principal Problem:   Intractable nausea and vomiting Active Problems:   Abnormal ECG   Deafness   Alcohol abuse   Intractable vomiting   Diverticulitis of sigmoid colon   Acute sigmoid diverticulitis:  - Patient does not meet sepsis criteria -presented with nausea, vomiting, abdominal discomfort.   - CT confirmed acute sigmoid diverticulitis without perforation.  - Antibiotics with ceftriaxone  and Flagyl , ongoing -Transiently worsening symptoms over the past 48 hours, repeat CT abdomen pelvis on 05/24/2024 without any notable changes, perforation or abscess formation. - Reglan  previously discontinued for prolonged QTc   Bradycardia/QT prolongation:  - Cardiology consulted and intake given questionable ST changes and T wave inversion with concurrent long QT - Echo without notable abnormalities, EF 55% without notable diastolic or systolic dysfunction - Troponin flat, not consistent with ACS - Avoid QT prolonging medications   Intractable nausea  - Vomiting appears to be improving - Continue scopolamine  patch, limited medication availability due to QT  prolongation  History of hypothyroidism  - Per chart review, no recent labs available, last in 2023 were within normal limits - Repeat labs here remain within normal limits - Patient not currently on any supplementation, unclear if patient carries this diagnosis or if her prior episode of hypothyroidism was transient/provoked  Hypokalemia/hypomagnesemia: Currently being monitored and supplemented.   Elevated lipase: Likely secondary to above - CT abdomen/pelvis x2 without overt inflammation or fluid collection near the pancreas   Chronic alcohol abuse:  - Questionable diagnosis -last drink was weeks ago, no signs or symptoms of withdrawal -No indication for CIWA protocol at this time given length of time without alcohol - Continue vitamin supplementation with thiamine  and folic acid    Hypertension:  Reportedly not on antihypertensive at home Trying to avoid nodal blocking agents due to bradycardia Losartan  added given borderline hypertension, avoid rate control medications due to ongoing bradycardia   DVT prophylaxis: enoxaparin  (LOVENOX ) injection 40 mg Start: 05/21/24 1000 Place TED hose Start: 05/20/24 2223   Code Status:   Code Status: Full Code  Family Communication: None present  Status is: Inpatient  Dispo: The patient is from: Home              Anticipated d/[name] is to: Home              Anticipated d/[name] date is: 72+hours              Patient currently not medically stable for discharge  Consultants:  Cardiology  Procedures:  None  Antimicrobials:  Ceftriaxone , Flagyl   Subjective: No acute issues or events overnight continues to have episodes of nausea without overt vomiting this morning  Objective: Vitals:   05/24/24 1301 05/24/24 2147 05/24/24 2347 05/25/24 0550  BP: ROLLEN)  149/76 (!) 154/69 (!) 145/86 (!) 149/83  Pulse: 60 (!) 54 62 (!) 51  Resp: 16 18 16 16   Temp: 97.9 F (36.6 [name]) 98.1 F (36.7 [name])  98.1 F (36.7 [name])  TempSrc: Oral Oral  Oral  SpO2:  98%  96% 97%  Weight:      Height:        Intake/Output Summary (Last 24 hours) at 05/25/2024 0750 Last data filed at 05/24/2024 1900 Gross per 24 hour  Intake 2255.68 ml  Output 700 ml  Net 1555.68 ml   Filed Weights   05/20/24 2349 05/24/24 1250  Weight: 83 kg 82.7 kg    Examination:  General:  Pleasantly resting in bed, No acute distress. HEENT:  Normocephalic atraumatic.  Sclerae nonicteric, noninjected.  Extraocular movements intact bilaterally. Neck:  Without mass or deformity.  Trachea is midline. Lungs:  Clear to auscultate bilaterally without rhonchi, wheeze, or rales. Heart:  Regular rate and rhythm.  Without murmurs, rubs, or gallops. Abdomen:  Soft, nontender, nondistended.  Without guarding or rebound. Extremities: Without cyanosis, clubbing, edema, or obvious deformity. Skin:  Warm and dry, no erythema.   Data Reviewed: I have personally reviewed following labs and imaging studies  CBC: Recent Labs  Lab 05/20/24 1740 05/21/24 0444 05/22/24 0459 05/23/24 0441 05/24/24 0546 05/25/24 0506  WBC 5.7 4.7 3.5* 5.5 4.9 5.3  NEUTROABS 3.7  --   --   --   --   --   HGB 18.2* 16.5* 15.4* 16.2* 15.2* 16.5*  HCT 54.5* 49.0* 47.6* 47.7* 47.0* 48.2*  MCV 89.9 91.1 91.4 91.0 92.3 88.6  PLT 327 265 249 269 253 278   Basic Metabolic Panel: Recent Labs  Lab 05/21/24 0444 05/22/24 0459 05/23/24 0441 05/23/24 1722 05/24/24 0546 05/25/24 0506  NA 132* 134* 134* 136 136 132*  K 3.4* 3.2* 3.1* 3.6 3.7 3.2*  CL 97* 100 99 100 102 98  CO2 20* 23 23 20* 22 21*  GLUCOSE 81 86 84 88 80 78  BUN 8 <5* <5* <5* <5* <5*  CREATININE 0.71 0.61 0.63 0.70 0.72 0.85  CALCIUM 8.9 8.8* 8.8* 8.9 8.7* 8.8*  MG 2.1 1.6* 2.0  --  1.8  --   PHOS 3.6 3.0 3.1  --  2.9  --    GFR: Estimated Creatinine Clearance: 74.4 mL/min (by [name]-G formula based on SCr of 0.85 mg/dL). Liver Function Tests: Recent Labs  Lab 05/20/24 1740 05/21/24 0444 05/24/24 0546  AST 26 17 19   ALT 25 21 16    ALKPHOS 85 71 54  BILITOT 1.4* 1.1 0.9  PROT 7.4 6.1* 5.6*  ALBUMIN 3.7 3.0* 2.9*   Recent Labs  Lab 05/20/24 1740 05/21/24 0444 05/23/24 1721 05/24/24 0546 05/25/24 0506  LIPASE 82* 108* 91* 124* 126*   No results for input(s): AMMONIA in the last 168 hours. Coagulation Profile: Recent Labs  Lab 05/21/24 0444  INR 1.0   CBG: Recent Labs  Lab 05/23/24 0002 05/23/24 0731 05/23/24 1616 05/24/24 0029 05/24/24 0735  GLUCAP 110* 96 94 88 88   No results found for this or any previous visit (from the past 240 hours).   Radiology Studies: CT ABDOMEN PELVIS W CONTRAST Result Date: 05/24/2024 CLINICAL DATA:  Left lower quadrant pain. Vomiting. Diverticulitis. EXAM: CT ABDOMEN AND PELVIS WITH CONTRAST TECHNIQUE: Multidetector CT imaging of the abdomen and pelvis was performed using the standard protocol following bolus administration of intravenous contrast. RADIATION DOSE REDUCTION: This exam was performed according to the departmental  dose-optimization program which includes automated exposure control, adjustment of the mA and/or kV according to patient size and/or use of iterative reconstruction technique. CONTRAST:  OMNIPAQUE  IOHEXOL  300 MG/ML  SOLN COMPARISON:  05/20/2024 FINDINGS: Lower Chest: No acute findings. Hepatobiliary: No suspicious hepatic masses identified. Gallstones are seen, but without signs of cholecystitis or biliary dilatation. Pancreas:  No mass or inflammatory changes. Spleen: Within normal limits in size and appearance. Adrenals/Urinary Tract: No suspicious masses identified. No evidence of ureteral calculi or hydronephrosis. Stomach/Bowel: Mild sigmoid diverticulitis shows no significant change compared to previous study. No No evidence of perforation or abscess. Vascular/Lymphatic: No pathologically enlarged lymph nodes. No acute vascular findings. Reproductive:  No mass or other significant abnormality. Other:  None. Musculoskeletal:  No suspicious bone  lesions identified. IMPRESSION: No significant change in mild sigmoid diverticulitis. No evidence of perforation or abscess. Cholelithiasis. No radiographic evidence of cholecystitis. Electronically Signed   By: Norleen DELENA Kil M.D.   On: 05/24/2024 11:24   ECHOCARDIOGRAM COMPLETE Result Date: 05/24/2024    ECHOCARDIOGRAM REPORT   Patient Name:   Shelley [name] [facility] Date of Exam: 05/24/2024 Medical Rec #:  [id number]     Height:       66.0 in Accession #:    [id number]    Weight:       183.0 lb Date of Birth:  12/02/1960      BSA:          1.926 m Patient Age:    63 years      BP:           158/82 mmHg Patient Gender: F             HR:           48 bpm. Exam Location:  Inpatient Procedure: 2D Echo, Cardiac Doppler and Color Doppler (Both Spectral and Color            Flow Doppler were utilized during procedure). Indications:    Abnormal ECG R94.31  History:        Patient has no prior history of Echocardiogram examinations.                 Risk Factors:Sleep Apnea.  Sonographer:    Jayson Gaskins Referring Phys: [id number] ELVAN SOR IMPRESSIONS  1. Left ventricular ejection fraction, by estimation, is 55%. The left ventricle has low normal function. The left ventricle has no regional wall motion abnormalities. There is mild left ventricular hypertrophy of the basal-septal segment. Left ventricular diastolic parameters were normal.  2. Right ventricular systolic function is normal. The right ventricular size is normal.  3. The mitral valve is grossly normal. Trivial mitral valve regurgitation. No evidence of mitral stenosis.  4. The aortic valve is tricuspid. Aortic valve regurgitation is not visualized. No aortic stenosis is present.  5. The inferior vena cava is normal in size with greater than 50% respiratory variability, suggesting right atrial pressure of 3 mmHg. Conclusion(s)/Recommendation(s): Normal biventricular function without evidence of hemodynamically significant valvular heart disease. FINDINGS  Left Ventricle:  Left ventricular ejection fraction, by estimation, is 55%. The left ventricle has low normal function. The left ventricle has no regional wall motion abnormalities. Strain was performed and the global longitudinal strain is indeterminate. The left ventricular internal cavity size was normal in size. There is mild left ventricular hypertrophy of the basal-septal segment. Left ventricular diastolic parameters were normal. Right Ventricle: The right ventricular size is normal. No increase in right ventricular wall thickness. Right  ventricular systolic function is normal. Left Atrium: Left atrial size was normal in size. Right Atrium: Right atrial size was normal in size. Pericardium: There is no evidence of pericardial effusion. Mitral Valve: The mitral valve is grossly normal. Normal mobility of the mitral valve leaflets. Trivial mitral valve regurgitation. No evidence of mitral valve stenosis. Tricuspid Valve: The tricuspid valve is grossly normal. Tricuspid valve regurgitation is trivial. No evidence of tricuspid stenosis. Aortic Valve: The aortic valve is tricuspid. Aortic valve regurgitation is not visualized. No aortic stenosis is present. Aortic valve mean gradient measures 4.0 mmHg. Aortic valve peak gradient measures 7.0 mmHg. Aortic valve area, by VTI measures 1.69 cm. Pulmonic Valve: The pulmonic valve was not well visualized. Pulmonic valve regurgitation is not visualized. No evidence of pulmonic stenosis. Aorta: The aortic root is normal in size and structure. Venous: The inferior vena cava is normal in size with greater than 50% respiratory variability, suggesting right atrial pressure of 3 mmHg. IAS/Shunts: No atrial level shunt detected by color flow Doppler. Additional Comments: 3D was performed not requiring image post processing on an independent workstation and was indeterminate.  LEFT VENTRICLE PLAX 2D LVIDd:         4.50 cm   Diastology LVIDs:         2.80 cm   LV e' medial:    4.24 cm/s LV PW:          1.00 cm   LV E/e' medial:  11.9 LV IVS:        1.20 cm   LV e' lateral:   6.31 cm/s LVOT diam:     2.00 cm   LV E/e' lateral: 8.0 LV SV:         46 LV SV Index:   24 LVOT Area:     3.14 cm  RIGHT VENTRICLE RV S prime:     12.10 cm/s LEFT ATRIUM             Index        RIGHT ATRIUM           Index LA Vol (A2C):   31.2 ml 16.20 ml/m  RA Area:     14.70 cm LA Vol (A4C):   13.7 ml 7.11 ml/m   RA Volume:   32.80 ml  17.03 ml/m LA Biplane Vol: 21.3 ml 11.06 ml/m  AORTIC VALVE AV Area (Vmax):    2.03 cm AV Area (Vmean):   1.83 cm AV Area (VTI):     1.69 cm AV Vmax:           132.00 cm/s AV Vmean:          98.900 cm/s AV VTI:            0.274 m AV Peak Grad:      7.0 mmHg AV Mean Grad:      4.0 mmHg LVOT Vmax:         85.50 cm/s LVOT Vmean:        57.700 cm/s LVOT VTI:          0.147 m LVOT/AV VTI ratio: 0.54  AORTA Ao Root diam: 2.90 cm MITRAL VALVE MV Area (PHT): 2.72 cm    SHUNTS MV E velocity: 50.60 cm/s  Systemic VTI:  0.15 m MV A velocity: 64.30 cm/s  Systemic Diam: 2.00 cm MV E/A ratio:  0.79 Maude Emmer MD Electronically signed by Maude Emmer MD Signature Date/Time: 05/24/2024/10:57:28 AM    Final     Scheduled Meds:  enoxaparin  (LOVENOX ) injection  40 mg Subcutaneous Q24H   losartan   25 mg Oral Daily   pantoprazole  (PROTONIX ) IV  40 mg Intravenous Q12H   scopolamine   1 patch Transdermal Q72H   Continuous Infusions:  cefTRIAXone  (ROCEPHIN )  IV 2 g (05/24/24 0938)   metronidazole  500 mg (05/24/24 2250)   promethazine  (PHENERGAN ) injection (IM or IVPB) 12.5 mg (05/24/24 1106)     LOS: 4 days   Time spent:  Elsie JAYSON [place], DO [facility]  If 7PM-7AM, please contact night-coverage [email or web address]  05/25/2024, 7:50 AM

## 2024-05-25 NOTE — Progress Notes (Signed)
 Subjective:   Patient is deaf written notes used  Seems to have less nausea  Objective:  Vitals:   05/24/24 1301 05/24/24 2147 05/24/24 2347 05/25/24 0550  BP: (!) 149/76 (!) 154/69 (!) 145/86 (!) 149/83  Pulse: 60 (!) 54 62 (!) 51  Resp: 16 18 16 16   Temp: 97.9 F (36.6 C) 98.1 F (36.7 C)  98.1 F (36.7 C)  TempSrc: Oral Oral  Oral  SpO2:  98% 96% 97%  Weight:      Height:        Intake/Output from previous day:  Intake/Output Summary (Last 24 hours) at 05/25/2024 0736 Last data filed at 05/24/2024 1900 Gross per 24 hour  Intake 2255.68 ml  Output 700 ml  Net 1555.68 ml    Physical Exam:  Deaf Lungs clear No murmur Abdomen mild mid line tenderness no rebound Palpable pedal pulses No edema  Lab Results: Basic Metabolic Panel: Recent Labs    05/23/24 0441 05/23/24 1722 05/24/24 0546 05/25/24 0506  NA 134*   < > 136 132*  K 3.1*   < > 3.7 3.2*  CL 99   < > 102 98  CO2 23   < > 22 21*  GLUCOSE 84   < > 80 78  BUN <5*   < > <5* <5*  CREATININE 0.63   < > 0.72 0.85  CALCIUM 8.8*   < > 8.7* 8.8*  MG 2.0  --  1.8  --   PHOS 3.1  --  2.9  --    < > = values in this interval not displayed.   Liver Function Tests: Recent Labs    05/24/24 0546  AST 19  ALT 16  ALKPHOS 54  BILITOT 0.9  PROT 5.6*  ALBUMIN 2.9*   Recent Labs    05/24/24 0546 05/25/24 0506  LIPASE 124* 126*   CBC: Recent Labs    05/24/24 0546 05/25/24 0506  WBC 4.9 5.3  HGB 15.2* 16.5*  HCT 47.0* 48.2*  MCV 92.3 88.6  PLT 253 278     Imaging: CT ABDOMEN PELVIS W CONTRAST Result Date: 05/24/2024 CLINICAL DATA:  Left lower quadrant pain. Vomiting. Diverticulitis. EXAM: CT ABDOMEN AND PELVIS WITH CONTRAST TECHNIQUE: Multidetector CT imaging of the abdomen and pelvis was performed using the standard protocol following bolus administration of intravenous contrast. RADIATION DOSE REDUCTION: This exam was performed according to the departmental dose-optimization program which  includes automated exposure control, adjustment of the mA and/or kV according to patient size and/or use of iterative reconstruction technique. CONTRAST:  OMNIPAQUE  IOHEXOL  300 MG/ML  SOLN COMPARISON:  05/20/2024 FINDINGS: Lower Chest: No acute findings. Hepatobiliary: No suspicious hepatic masses identified. Gallstones are seen, but without signs of cholecystitis or biliary dilatation. Pancreas:  No mass or inflammatory changes. Spleen: Within normal limits in size and appearance. Adrenals/Urinary Tract: No suspicious masses identified. No evidence of ureteral calculi or hydronephrosis. Stomach/Bowel: Mild sigmoid diverticulitis shows no significant change compared to previous study. No No evidence of perforation or abscess. Vascular/Lymphatic: No pathologically enlarged lymph nodes. No acute vascular findings. Reproductive:  No mass or other significant abnormality. Other:  None. Musculoskeletal:  No suspicious bone lesions identified. IMPRESSION: No significant change in mild sigmoid diverticulitis. No evidence of perforation or abscess. Cholelithiasis. No radiographic evidence of cholecystitis. Electronically Signed   By: Norleen DELENA Kil M.D.   On: 05/24/2024 11:24   ECHOCARDIOGRAM COMPLETE Result Date: 05/24/2024    ECHOCARDIOGRAM REPORT   Patient Name:  Analyssa C [facility] Date of Exam: 05/24/2024 Medical Rec #:  [id number]     Height:       66.0 in Accession #:    [id number]    Weight:       183.0 lb Date of Birth:  January 27, 1961      BSA:          1.926 m Patient Age:    63 years      BP:           158/82 mmHg Patient Gender: F             HR:           48 bpm. Exam Location:  Inpatient Procedure: 2D Echo, Cardiac Doppler and Color Doppler (Both Spectral and Color            Flow Doppler were utilized during procedure). Indications:    Abnormal ECG R94.31  History:        Patient has no prior history of Echocardiogram examinations.                 Risk Factors:Sleep Apnea.  Sonographer:    Jayson Gaskins Referring  Phys: [id number] ELVAN SOR IMPRESSIONS  1. Left ventricular ejection fraction, by estimation, is 55%. The left ventricle has low normal function. The left ventricle has no regional wall motion abnormalities. There is mild left ventricular hypertrophy of the basal-septal segment. Left ventricular diastolic parameters were normal.  2. Right ventricular systolic function is normal. The right ventricular size is normal.  3. The mitral valve is grossly normal. Trivial mitral valve regurgitation. No evidence of mitral stenosis.  4. The aortic valve is tricuspid. Aortic valve regurgitation is not visualized. No aortic stenosis is present.  5. The inferior vena cava is normal in size with greater than 50% respiratory variability, suggesting right atrial pressure of 3 mmHg. Conclusion(s)/Recommendation(s): Normal biventricular function without evidence of hemodynamically significant valvular heart disease. FINDINGS  Left Ventricle: Left ventricular ejection fraction, by estimation, is 55%. The left ventricle has low normal function. The left ventricle has no regional wall motion abnormalities. Strain was performed and the global longitudinal strain is indeterminate. The left ventricular internal cavity size was normal in size. There is mild left ventricular hypertrophy of the basal-septal segment. Left ventricular diastolic parameters were normal. Right Ventricle: The right ventricular size is normal. No increase in right ventricular wall thickness. Right ventricular systolic function is normal. Left Atrium: Left atrial size was normal in size. Right Atrium: Right atrial size was normal in size. Pericardium: There is no evidence of pericardial effusion. Mitral Valve: The mitral valve is grossly normal. Normal mobility of the mitral valve leaflets. Trivial mitral valve regurgitation. No evidence of mitral valve stenosis. Tricuspid Valve: The tricuspid valve is grossly normal. Tricuspid valve regurgitation is trivial. No  evidence of tricuspid stenosis. Aortic Valve: The aortic valve is tricuspid. Aortic valve regurgitation is not visualized. No aortic stenosis is present. Aortic valve mean gradient measures 4.0 mmHg. Aortic valve peak gradient measures 7.0 mmHg. Aortic valve area, by VTI measures 1.69 cm. Pulmonic Valve: The pulmonic valve was not well visualized. Pulmonic valve regurgitation is not visualized. No evidence of pulmonic stenosis. Aorta: The aortic root is normal in size and structure. Venous: The inferior vena cava is normal in size with greater than 50% respiratory variability, suggesting right atrial pressure of 3 mmHg. IAS/Shunts: No atrial level shunt detected by color flow Doppler. Additional Comments: 3D was performed not requiring image post  processing on an independent workstation and was indeterminate.  LEFT VENTRICLE PLAX 2D LVIDd:         4.50 cm   Diastology LVIDs:         2.80 cm   LV e' medial:    4.24 cm/s LV PW:         1.00 cm   LV E/e' medial:  11.9 LV IVS:        1.20 cm   LV e' lateral:   6.31 cm/s LVOT diam:     2.00 cm   LV E/e' lateral: 8.0 LV SV:         46 LV SV Index:   24 LVOT Area:     3.14 cm  RIGHT VENTRICLE RV S prime:     12.10 cm/s LEFT ATRIUM             Index        RIGHT ATRIUM           Index LA Vol (A2C):   31.2 ml 16.20 ml/m  RA Area:     14.70 cm LA Vol (A4C):   13.7 ml 7.11 ml/m   RA Volume:   32.80 ml  17.03 ml/m LA Biplane Vol: 21.3 ml 11.06 ml/m  AORTIC VALVE AV Area (Vmax):    2.03 cm AV Area (Vmean):   1.83 cm AV Area (VTI):     1.69 cm AV Vmax:           132.00 cm/s AV Vmean:          98.900 cm/s AV VTI:            0.274 m AV Peak Grad:      7.0 mmHg AV Mean Grad:      4.0 mmHg LVOT Vmax:         85.50 cm/s LVOT Vmean:        57.700 cm/s LVOT VTI:          0.147 m LVOT/AV VTI ratio: 0.54  AORTA Ao Root diam: 2.90 cm MITRAL VALVE MV Area (PHT): 2.72 cm    SHUNTS MV E velocity: 50.60 cm/s  Systemic VTI:  0.15 m MV A velocity: 64.30 cm/s  Systemic Diam: 2.00 cm MV  E/A ratio:  0.79 Maude Emmer MD Electronically signed by Maude Emmer MD Signature Date/Time: 05/24/2024/10:57:28 AM    Final     Cardiac Studies:  ECG: SB rate 51 QTC 434 deep T wave inversions inferior lateral leads    Telemetry: SR/SB no AV block  Echo: EF 55% no RWMA no valve dx  Medications:    enoxaparin  (LOVENOX ) injection  40 mg Subcutaneous Q24H   losartan   25 mg Oral Daily   pantoprazole  (PROTONIX ) IV  40 mg Intravenous Q12H   scopolamine   1 patch Transdermal Q72H      cefTRIAXone  (ROCEPHIN )  IV 2 g (05/24/24 0938)   metronidazole  500 mg (05/24/24 2250)   promethazine  (PHENERGAN ) injection (IM or IVPB) 12.5 mg (05/24/24 1106)    Assessment/Plan:   Abnormal ECG:  bradycardia, long QT and T wave inversions in absence of vascular history and no chest pain Troponin negative 35/32 TTE with normal EF 55% no RWMA;s. Anti nausea meds Reglan  stopped and on scopolamine  patch now Can also consider Dronabinol or dexamethazone.  Diverticulitis  continue metronidazole  and rocephin  Thyroid  check TSH HTN  on low dose losartan  can increase doe to max of 100 mg daily as needed  OSA:  use CPAP  at night avoid bradycardia with hypoxia   Maude Emmer 05/25/2024, 7:36 AM

## 2024-05-26 DIAGNOSIS — I4581 Long QT syndrome: Secondary | ICD-10-CM | POA: Diagnosis not present

## 2024-05-26 DIAGNOSIS — R112 Nausea with vomiting, unspecified: Secondary | ICD-10-CM | POA: Diagnosis not present

## 2024-05-26 LAB — CBC
HCT: 47 % — ABNORMAL HIGH (ref 36.0–46.0)
Hemoglobin: 16.5 g/dL — ABNORMAL HIGH (ref 12.0–15.0)
MCH: 31 pg (ref 26.0–34.0)
MCHC: 35.1 g/dL (ref 30.0–36.0)
MCV: 88.3 fL (ref 80.0–100.0)
Platelets: 247 K/uL (ref 150–400)
RBC: 5.32 MIL/uL — ABNORMAL HIGH (ref 3.87–5.11)
RDW: 13.5 % (ref 11.5–15.5)
WBC: 4.4 K/uL (ref 4.0–10.5)
nRBC: 0 % (ref 0.0–0.2)

## 2024-05-26 LAB — BASIC METABOLIC PANEL WITH GFR
Anion gap: 11 (ref 5–15)
BUN: <5 mg/dL — ABNORMAL LOW (ref 8–23)
CO2: 18 mmol/L — ABNORMAL LOW (ref 22–32)
Calcium: 8.8 mg/dL — ABNORMAL LOW (ref 8.9–10.3)
Chloride: 102 mmol/L (ref 98–111)
Creatinine, Ser: 0.73 mg/dL (ref 0.44–1.00)
GFR, Estimated: >60 mL/min (ref >60)
Glucose, Bld: 74 mg/dL (ref 70–99)
Potassium: 3.7 mmol/L (ref 3.5–5.1)
Sodium: 131 mmol/L — ABNORMAL LOW (ref 135–145)

## 2024-05-26 LAB — LIPASE, BLOOD: Lipase: 115 U/L — ABNORMAL HIGH (ref 11–51)

## 2024-05-26 MED ORDER — PROCHLORPERAZINE EDISYLATE 10 MG/2ML IJ SOLN
10.0000 mg | Freq: Four times a day (QID) | INTRAMUSCULAR | Status: DC | PRN
  Administered 2024-05-26 – 2024-05-27 (×2): 10 mg via INTRAVENOUS
  Filled 2024-05-26 (×2): qty 2

## 2024-05-26 MED ORDER — LACTATED RINGERS IV SOLN: Freq: Once | INTRAVENOUS | Status: AC

## 2024-05-26 NOTE — Progress Notes (Signed)
   05/26/24 0319  Provider Notification  Provider Name/Title Dr. Patsy Lenis  Date Provider Notified 05/26/24  Time Provider Notified 0321  Method of Notification Page (secured chat)  Notification Reason We got a new report that Pt has prolong QT.530 by central cardiac monitor team. Her HR now is in 45-47 BP stable 144/70 mmHg. Pt is sleeping. Concerning  about compazine  has side effects of prolong QT. Pt has retractable nausea and vomiting, unable to have oral intake. Request for continue IV fluid.   Provider response Dr. Patsy evaluates remotely;See new orders LR 50 ml/hr. Dr. Patsy is aware that Pt has prolong QT. Compazine  is safe to use for now. We continue to monitor EKG closely.   Date of Provider Response 05/26/24  Time of Provider Response 0322    Wendi Dash, RN

## 2024-05-26 NOTE — Progress Notes (Signed)
 PROGRESS NOTE    Shelley [name]  FMW:[id number] DOB: [birth year] DOA: 05/20/2024 PCP: Ilah Crigler, MD   Brief Narrative:  This 63 years old female with history of deafness, sleep apnea, alcohol abuse who presented to the ED with complaints of nausea, vomiting, abdominal discomfort for 2 weeks. Had multiple ER visits for the same recently. On presentation, CT abdomen/pelvis showed acute sigmoid diverticulitis with increasing inflammatory stranding, started on antibiotics for diverticulitis. Cultures have not been sent. Hospital course remarkable for intractable nausea and vomiting.  Repeat CT abdomen shows no changes.  Her EKG also showed QT prolongation, bradycardia, ST changes so cardiology consulted. Due to her EKG changes, cardiology recommended to transfer to [facility].   Assessment & Plan:   Principal Problem:   Intractable nausea and vomiting Active Problems:   Abnormal ECG   Deafness   Alcohol abuse   Intractable vomiting   Diverticulitis of sigmoid colon   Acute sigmoid diverticulitis:  - Patient does not meet sepsis criteria -presented with nausea, vomiting, abdominal discomfort.   - CT confirmed acute sigmoid diverticulitis without perforation.  - Continue Antibiotics with ceftriaxone  and Flagyl  -Transiently worsening symptoms over the past 48 hours, repeat CT abdomen pelvis on 05/24/2024 without any notable changes, perforation or abscess formation. - Reglan  previously discontinued for prolonged Qtc.   Bradycardia/QT prolongation:  - Cardiology consulted given questionable ST changes and T wave inversion with concurrent long QT - Echo without notable abnormalities, EF 55% without notable diastolic or systolic dysfunction - Troponin flat, not consistent with ACS - Avoid QT prolonging medications. - She is getting a scopolamine  patch, Compazine  and Zyprexa  for severe vomiting. -Consider IM Tigan.   Intractable nausea : - Vomiting appears to be improving. - Continue scopolamine   patch, limited medication availability due to QT prolongation.   History of hypothyroidism: - Per chart review, no recent labs available, last in 2023 were within normal limits. - Repeat labs here remain within normal limits - Patient not currently on any supplementation, unclear if patient carries this diagnosis or if her prior episode of hypothyroidism was transient/provoked.   Hypokalemia/hypomagnesemia: Replaced.  Continue to monitor   Elevated lipase: Likely secondary to above - CT abdomen/pelvis x2 without overt inflammation or fluid collection near the pancreas   Chronic alcohol abuse:  -Questionable diagnosis -last drink was weeks ago, no signs or symptoms of withdrawal -No indication for CIWA protocol at this time given length of time without alcohol -Continue vitamin supplementation with thiamine  and folic acid    Hypertension:  Reportedly not on antihypertensive at home. Trying to avoid nodal blocking agents due to bradycardia Losartan  added given borderline hypertension, avoid rate control medications due to ongoing bradycardia     DVT prophylaxis:Lovenox  Code Status: Full code Family Communication:No family at bed side Disposition Plan:    Status is: Inpatient Remains inpatient appropriate because: Severity of illness     Consultants:  Cardiology Gastroenterology  Procedures:  Antimicrobials: Anti-infectives (From admission, onward)    Start     Dose/Rate Route Frequency Ordered Stop   05/21/24 1000  metroNIDAZOLE  (FLAGYL ) IVPB 500 mg        500 mg 100 mL/hr over 60 Minutes Intravenous Every 12 hours 05/21/24 0832     05/21/24 1000  cefTRIAXone  (ROCEPHIN ) 2 g in sodium chloride  0.9 % 100 mL IVPB        2 g 200 mL/hr over 30 Minutes Intravenous Every 24 hours 05/21/24 0842     05/21/24 0930  ciprofloxacin  (CIPRO ) IVPB 400 mg  Status:  Discontinued        400 mg 200 mL/hr over 60 Minutes Intravenous Every 12 hours 05/21/24 9167 05/21/24 0841        Subjective: Patient was seen and examined at bedside.  Overnight events noted. Language interpreter used for sign language.  All the patient's questions were answered. Family at bedside  Objective: Vitals:   05/25/24 2305 05/26/24 0319 05/26/24 0347 05/26/24 1152  BP: (!) 142/66 (!) 144/70  (!) 144/78  Pulse: (!) 46 (!) 47 (!) 48   Resp: 18 18 18 18   Temp: 98 F (36.7 C) 98 F (36.7 C)  98 F (36.7 C)  TempSrc: Oral Oral  Oral  SpO2: 98% 97% 97%   Weight:      Height:        Intake/Output Summary (Last 24 hours) at 05/26/2024 1310 Last data filed at 05/26/2024 0323 Gross per 24 hour  Intake 1714.82 ml  Output --  Net 1714.82 ml   Filed Weights   05/20/24 2349 05/24/24 1250  Weight: 83 kg 82.7 kg    Examination:  General exam: Appears calm and comfortable, not in any acute distress. Respiratory system: Clear to auscultation. Respiratory effort normal.  RR 16 Cardiovascular system: S1 & S2 heard, RRR. No JVD, murmurs, rubs, gallops or clicks.  Gastrointestinal system: Abdomen is nondistended, soft and nontender.  Normal bowel sounds heard. Central nervous system: Alert and oriented x 3. No focal neurological deficits. Extremities: No edema, no cyanosis, no clubbing Skin: No rashes, lesions or ulcers Psychiatry: Judgement and insight appear normal. Mood & affect appropriate.     Data Reviewed: I have personally reviewed following labs and imaging studies  CBC: Recent Labs  Lab 05/20/24 1740 05/21/24 0444 05/22/24 0459 05/23/24 0441 05/24/24 0546 05/25/24 0506 05/26/24 0539  WBC 5.7   < > 3.5* 5.5 4.9 5.3 4.4  NEUTROABS 3.7  --   --   --   --   --   --   HGB 18.2*   < > 15.4* 16.2* 15.2* 16.5* 16.5*  HCT 54.5*   < > 47.6* 47.7* 47.0* 48.2* 47.0*  MCV 89.9   < > 91.4 91.0 92.3 88.6 88.3  PLT 327   < > 249 269 253 278 247   < > = values in this interval not displayed.   Basic Metabolic Panel: Recent Labs  Lab 05/21/24 0444 05/22/24 0459 05/23/24 0441  05/23/24 1722 05/24/24 0546 05/25/24 0506 05/26/24 0539  NA 132* 134* 134* 136 136 132* 131*  K 3.4* 3.2* 3.1* 3.6 3.7 3.2* 3.7  CL 97* 100 99 100 102 98 102  CO2 20* 23 23 20* 22 21* 18*  GLUCOSE 81 86 84 88 80 78 74  BUN 8 <5* <5* <5* <5* <5* <5*  CREATININE 0.71 0.61 0.63 0.70 0.72 0.85 0.73  CALCIUM 8.9 8.8* 8.8* 8.9 8.7* 8.8* 8.8*  MG 2.1 1.6* 2.0  --  1.8  --   --   PHOS 3.6 3.0 3.1  --  2.9  --   --    GFR: Estimated Creatinine Clearance: 79.1 mL/min (by C-G formula based on SCr of 0.73 mg/dL). Liver Function Tests: Recent Labs  Lab 05/20/24 1740 05/21/24 0444 05/24/24 0546  AST 26 17 19   ALT 25 21 16   ALKPHOS 85 71 54  BILITOT 1.4* 1.1 0.9  PROT 7.4 6.1* 5.6*  ALBUMIN 3.7 3.0* 2.9*   Recent Labs  Lab 05/21/24 0444 05/23/24 1721 05/24/24 0546 05/25/24  9493 05/26/24 0539  LIPASE 108* 91* 124* 126* 115*   No results for input(s): AMMONIA in the last 168 hours. Coagulation Profile: Recent Labs  Lab 05/21/24 0444  INR 1.0   Cardiac Enzymes: No results for input(s): CKTOTAL, CKMB, CKMBINDEX, TROPONINI in the last 168 hours. BNP (last 3 results) No results for input(s): PROBNP in the last 8760 hours. HbA1C: No results for input(s): HGBA1C in the last 72 hours. CBG: Recent Labs  Lab 05/23/24 0002 05/23/24 0731 05/23/24 1616 05/24/24 0029 05/24/24 0735  GLUCAP 110* 96 94 88 88   Lipid Profile: No results for input(s): CHOL, HDL, LDLCALC, TRIG, CHOLHDL, LDLDIRECT in the last 72 hours. Thyroid  Function Tests: Recent Labs    05/25/24 0857  TSH 0.692  FREET4 1.06   Anemia Panel: No results for input(s): VITAMINB12, FOLATE, FERRITIN, TIBC, IRON, RETICCTPCT in the last 72 hours. Sepsis Labs: No results for input(s): PROCALCITON, LATICACIDVEN in the last 168 hours.  No results found for this or any previous visit (from the past 240 hours).   Radiology Studies: No results found.  Scheduled Meds:   enoxaparin  (LOVENOX ) injection  40 mg Subcutaneous Q24H   losartan   25 mg Oral Daily   OLANZapine  zydis  5 mg Oral Daily   pantoprazole  (PROTONIX ) IV  40 mg Intravenous Q12H   scopolamine   1 patch Transdermal Q72H   Continuous Infusions:  cefTRIAXone  (ROCEPHIN )  IV 2 g (05/26/24 1003)   metronidazole  500 mg (05/26/24 1004)     LOS: 5 days    Time spent: 50 mins    Darcel Dawley, MD [facility]   If 7PM-7AM, please contact night-coverage

## 2024-05-26 NOTE — Progress Notes (Signed)
   05/26/24 0133  Provider Notification  Provider Name/Title Dr. Patsy Lenis  Date Provider Notified 05/26/24  Time Provider Notified 0133  Method of Notification Page  Notification Reason Pt has intractable nausea and vomiting. Request an order for antiemetic drug.  Provider response Evaluate remotely;See new orders for Compazine  10 mg PRN q 6 hrs.  Date of Provider Response 05/26/24  Time of Provider Response 0138    Wendi Dash, RN

## 2024-05-26 NOTE — Progress Notes (Signed)
  Progress Note  Patient Name: Shelley [name] Date of Encounter: 05/26/2024 [facility] Cardiologist: None   Interval Summary   Remains bradycardic, no ventricular arrhythmia. Sleeping. QT still very prolonged.  Vital Signs Vitals:   05/25/24 1950 05/25/24 2305 05/26/24 0319 05/26/24 0347  BP: (!) 135/37 (!) 142/66 (!) 144/70   Pulse: (!) 51 (!) 46 (!) 47 (!) 48  Resp: 20 18 18 18   Temp: 98 F (36.7 C) 98 F (36.7 C) 98 F (36.7 C)   TempSrc: Oral Oral Oral   SpO2: 98% 98% 97% 97%  Weight:      Height:        Intake/Output Summary (Last 24 hours) at 05/26/2024 0853 Last data filed at 05/26/2024 0323 Gross per 24 hour  Intake 1714.82 ml  Output --  Net 1714.82 ml      05/24/2024   12:50 PM 05/20/2024   11:49 PM 05/12/2024    2:53 PM  Last 3 Weights  Weight (lbs) 182 lb 6.4 oz 182 lb 15.7 oz 160 lb  Weight (kg) 82.736 kg 83 kg 72.576 kg      Telemetry/ECG  Sinus bradycardia, uncorrected QT approx 590 ms - Personally Reviewed  Physical Exam  GEN: No acute distress.   Neck: No JVD Cardiac: RRR, no murmurs, rubs, or gallops.  Respiratory: Clear to auscultation bilaterally. GI: Soft, nontender, non-distended  MS: No edema  Assessment & Plan   Awaiting 12 lead ECG, but QT is still severely prolonged. Thankfully without ventricular arrhythmia. Continues to have marked ECG abnormalities, primarily very long QTc interval. Receiving scopolamine  (patch), compazine  and zyprexa  for severe vomiting. The latter 2 meds do have a QT prolonging effect, albeit not as pronounced as Zofran  or Reglan . As N/V abates, stop compazine  first, as this is the most likely agent to prolong QT. Avoid fluoroquinolones, macrolides and any other Qt prolonging drugs. Consider IM Tigan.   For questions or updates, please contact [facility] Please consult [email or web address] for contact info under       Signed, Jerel Balding, MD

## 2024-05-26 NOTE — Plan of Care (Signed)

## 2024-05-26 NOTE — Consult Note (Addendum)
 Reason for Consult: Abdominal pain-acute sigmoid diverticulitis.  Referring Physician: THP. Shelley [name] is an 63 y.o. female.  HPI: Shelley [name] is a 63 year old black female with multiple medical problems listed below, who presented the emergency room with nausea vomiting and abdominal pain in the left lower quadrant for 2 weeks. I tried to call the patient's daughter but I was not able to get in touch with her.  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 Aortic atherosclerosis also noted. No bowel obstruction or perforation was noted. And she was started on antibiotics; Her symptoms worsened over the next couple of days and she had a repeat CT that showed no acute changes with no evidence of perforation or abscess and cholelithiasis was noted. Her EKG showed prolonged QT drawl bradycardia with ST segment changes and therefore a cardiology was consulted.  No ventricular arrhythmia was noted. She was noted to have prolonged QT intervals secondary to Scopolamine  patch, Compazine  and Zyprexa . Her last colonoscopy done on 07/03/2021 revealed scattered diverticula throughout the colon and patchy area of congested mucosa sigmoid colon that showed nonspecific inflammation and pathology.  Past Medical History:  Diagnosis Date   Allergy    Alcohol abuse    Deaf and mute    OSA on CPAP 10/03/2011   Thyroid  disease    History reviewed. No pertinent surgical history.  Family History  Problem Relation Age of Onset   Arthritis Mother    Diabetes Mother    Heart disease Father    Hyperlipidemia Father    Hypertension Father    Stroke Father    Alcohol  abuse Neg Hx    Cancer Neg Hx    Early death Neg Hx    Kidney disease Neg Hx    Breast cancer Neg Hx    Social History:  reports that she has been smoking cigarettes. She has never used smokeless tobacco. She reports that she does not drink alcohol and does not use drugs.  Allergies:  Allergies  Allergen Reactions   Penicillins Other (See Comments)    Caused deafness per pt   Tylenol [Acetaminophen] Other (See Comments)    Causes shaking   Medications: I have reviewed the patient's current medications. Prior to Admission:  Medications Prior to Admission  Medication Sig Dispense Refill Last Dose/Taking   metoCLOPramide  (REGLAN ) 10 MG tablet Take 1 tablet (10 mg total) by mouth every 8 (eight) hours as needed for nausea or vomiting. 30 tablet 0 05/20/2024   pantoprazole  (PROTONIX ) 20 MG tablet Take 1 tablet (20 mg total) by mouth daily. 30 tablet 2 Past Week   potassium chloride  SA (KLOR-CON  M) 20 MEQ tablet Take 2 tablets (40 mEq total) by mouth daily for 5 days. (Patient not taking: Reported on 05/21/2024) 10 tablet 0 Not Taking   sucralfate  (CARAFATE ) 1 g tablet Take 1 tablet (1 g total) by mouth 4 (four) times daily -  with meals and at bedtime. (Patient not taking: Reported on 05/21/2024) 120 tablet 0 Not Taking   Scheduled:  enoxaparin  (LOVENOX ) injection  40 mg Subcutaneous Q24H   losartan   25 mg Oral Daily   OLANZapine  zydis  5 mg Oral Daily   pantoprazole  (PROTONIX ) IV  40 mg Intravenous Q12H   scopolamine   1 patch Transdermal Q72H   Continuous:  cefTRIAXone  (ROCEPHIN )  IV Stopped (05/26/24 1049)   metronidazole  Stopped (05/26/24 1110)   PRN:hydrALAZINE , mouth rinse, polyethylene glycol, prochlorperazine , traZODone   Results for orders placed or performed during the hospital encounter of 05/20/24 (from the past 48 hours)  Basic metabolic panel with GFR     Status: Abnormal   Collection Time: 05/25/24  5:06 AM  Result Value Ref Range   Sodium 132 (L) 135 - 145 mmol/L    Potassium 3.2 (L) 3.5 - 5.1 mmol/L   Chloride 98 98 - 111 mmol/L   CO2 21 (L) 22 - 32 mmol/L   Glucose, Bld 78 70 - 99 mg/dL    Comment: Glucose reference range applies only to samples taken after fasting for at least 8 hours.   BUN <5 (L) 8 - 23 mg/dL   Creatinine, Ser 9.14 0.44 - 1.00 mg/dL   Calcium 8.8 (L) 8.9 - 10.3 mg/dL   GFR, Estimated >39 >39 mL/min    Comment: (NOTE) Calculated using the CKD-EPI Creatinine Equation (2021)    Anion gap 13 5 - 15    Comment: Performed at [facility], [address] [place], [place] [address]  CBC     Status: Abnormal   Collection Time: 05/25/24  5:06 AM  Result Value Ref Range   WBC 5.3 4.0 - 10.5 K/uL   RBC 5.44 (H) 3.87 - 5.11 MIL/uL   Hemoglobin 16.5 (H) 12.0 - 15.0 g/dL   HCT 51.7 (H) 63.9 - 53.9 %   MCV 88.6 80.0 - 100.0 fL   MCH 30.3 26.0 - 34.0 pg   MCHC 34.2 30.0 - 36.0 g/dL   RDW 86.3 88.4 - 84.4 %   Platelets 278 150 - 400 K/uL   nRBC 0.0 0.0 - 0.2 %    Comment: Performed at [facility], [address], [place], [place] [address]  Lipase, blood     Status: Abnormal   Collection Time: 05/25/24  5:06 AM  Result Value Ref Range   Lipase 126 (H) 11 - 51 U/L    Comment: Performed at [facility], [address], [place], [place] [address]  TSH     Status: None   Collection Time: 05/25/24  8:57 AM  Result Value Ref Range   TSH 0.692 0.350 - 4.500 uIU/mL    Comment: Performed by a 3rd Generation assay with a functional sensitivity of <=0.01 uIU/mL. Performed at [facility], [address] [place], [place] [address]   T4, free     Status: None   Collection Time: 05/25/24  8:57 AM  Result Value Ref Range   Free T4 1.06 0.61 - 1.12 ng/dL    Comment: (NOTE) Biotin ingestion may interfere with free T4 tests. If the results are inconsistent with the TSH level, previous test results, or the clinical presentation, then consider biotin interference. If needed, order repeat testing after stopping  biotin. Performed at [facility], [address], [place], [place] [address]   Basic metabolic panel with GFR     Status: Abnormal   Collection Time: 05/26/24  5:39 AM  Result Value Ref Range   Sodium 131 (L) 135 - 145 mmol/L   Potassium 3.7 3.5 - 5.1 mmol/L   Chloride 102 98 - 111 mmol/L   CO2 18 (L) 22 - 32 mmol/L   Glucose, Bld 74 70 - 99  mg/dL    Comment: Glucose reference range applies only to samples taken after fasting for at least 8 hours.   BUN <5 (L) 8 - 23 mg/dL   Creatinine, Ser 9.26 0.44 - 1.00 mg/dL   Calcium 8.8 (L) 8.9 - 10.3 mg/dL   GFR, Estimated >39 >39 mL/min    Comment: (NOTE) Calculated using the CKD-EPI Creatinine Equation (2021)    Anion gap 11 5 - 15    Comment: Performed at [facility], [address] [place], [place] [address]  CBC     Status: Abnormal   Collection Time: 05/26/24  5:39 AM  Result Value Ref Range   WBC 4.4 4.0 - 10.5 K/uL   RBC 5.32 (H) 3.87 - 5.11 MIL/uL   Hemoglobin 16.5 (H) 12.0 - 15.0 g/dL   HCT 52.9 (H) 63.9 - 53.9 %   MCV 88.3 80.0 - 100.0 fL   MCH 31.0 26.0 - 34.0 pg   MCHC 35.1 30.0 - 36.0 g/dL   RDW 86.4 88.4 - 84.4 %   Platelets 247 150 - 400 K/uL   nRBC 0.0 0.0 - 0.2 %    Comment: Performed at [facility], [address] [place], [place] [address]  Lipase, blood     Status: Abnormal   Collection Time: 05/26/24  5:39 AM  Result Value Ref Range   Lipase 115 (H) 11 - 51 U/L    Comment: Performed at [facility], [address], [place], [place] [address]   Review of Systems  Unable to perform ROS: Patient nonverbal   Blood pressure (!) 144/78, pulse (!) 48, temperature 98 F (36.7 C), temperature source Oral, resp. rate 18, height 5' 6 (1.676 m), weight 82.7 kg, SpO2 97%. Physical Exam Vitals reviewed.  Constitutional:      General: She is not in acute distress.    Appearance: She is not ill-appearing or toxic-appearing.  HENT:     Head: Normocephalic and atraumatic.      Mouth/Throat:     Mouth: Mucous membranes are dry.  Eyes:     Extraocular Movements: Extraocular movements intact.     Pupils: Pupils are equal, round, and reactive to light.  Cardiovascular:     Rate and Rhythm: Normal rate and regular rhythm.     Pulses: Normal pulses.     Heart sounds: Normal heart sounds.  Pulmonary:     Effort: Pulmonary effort is normal.     Breath sounds: Normal breath sounds.  Abdominal:     General: Bowel sounds are normal. There is no distension.     Tenderness: There is abdominal tenderness. There is no guarding.  Musculoskeletal:     Cervical back: Normal range of motion.  Skin:    General: Skin is warm and dry.  Neurological:     Mental Status: She is alert and oriented to person, place, and time.  Psychiatric:        Behavior: Behavior normal.   Assessment/Plan: 1 Acute sigmoid diverticulitis on ceftriaxone  and Flagyl . 2) Bradycardia with prolonged QT interval. 3) Intractable nausea and vomiting-seems to be improving 4) Chronic alcohol abuse 5) fatty infiltration of the liver. 6) Cholelithiasis which may be causing some of the nausea and vomiting she is experiencing.  She needs to keep a track of her symptoms if her symptoms are worse after meals and with fatty foods. 7) Hypertension 8) Hypothyroidism. 9) Fat containing umbilical hernia. 10) osteopenia with degenerative changes in the spine and  ankylosis of the inferior SI joints. 11) Obesity. nDeaf and mute. Shelley [name] 05/26/2024, 1:48 PM

## 2024-05-27 DIAGNOSIS — I4581 Long QT syndrome: Secondary | ICD-10-CM | POA: Diagnosis not present

## 2024-05-27 DIAGNOSIS — R112 Nausea with vomiting, unspecified: Secondary | ICD-10-CM | POA: Diagnosis not present

## 2024-05-27 DIAGNOSIS — I10 Essential (primary) hypertension: Secondary | ICD-10-CM | POA: Diagnosis not present

## 2024-05-27 LAB — BASIC METABOLIC PANEL WITH GFR
Anion gap: 13 (ref 5–15)
BUN: <5 mg/dL — ABNORMAL LOW (ref 8–23)
CO2: 21 mmol/L — ABNORMAL LOW (ref 22–32)
Calcium: 9.1 mg/dL (ref 8.9–10.3)
Chloride: 97 mmol/L — ABNORMAL LOW (ref 98–111)
Creatinine, Ser: 0.79 mg/dL (ref 0.44–1.00)
GFR, Estimated: >60 mL/min (ref >60)
Glucose, Bld: 73 mg/dL (ref 70–99)
Potassium: 3.4 mmol/L — ABNORMAL LOW (ref 3.5–5.1)
Sodium: 131 mmol/L — ABNORMAL LOW (ref 135–145)

## 2024-05-27 LAB — CBC
HCT: 46.2 % — ABNORMAL HIGH (ref 36.0–46.0)
Hemoglobin: 16 g/dL — ABNORMAL HIGH (ref 12.0–15.0)
MCH: 30.6 pg (ref 26.0–34.0)
MCHC: 34.6 g/dL (ref 30.0–36.0)
MCV: 88.3 fL (ref 80.0–100.0)
Platelets: 236 K/uL (ref 150–400)
RBC: 5.23 MIL/uL — ABNORMAL HIGH (ref 3.87–5.11)
RDW: 13.6 % (ref 11.5–15.5)
WBC: 5 K/uL (ref 4.0–10.5)
nRBC: 0 % (ref 0.0–0.2)

## 2024-05-27 LAB — MAGNESIUM: Magnesium: 1.5 mg/dL — ABNORMAL LOW (ref 1.7–2.4)

## 2024-05-27 LAB — PHOSPHORUS: Phosphorus: 2.9 mg/dL (ref 2.5–4.6)

## 2024-05-27 MED ORDER — MAGNESIUM SULFATE 4 GM/100ML IV SOLN
4.0000 g | Freq: Once | INTRAVENOUS | Status: AC
  Administered 2024-05-27: 4 g via INTRAVENOUS
  Filled 2024-05-27: qty 100

## 2024-05-27 MED ORDER — POTASSIUM CHLORIDE 10 MEQ/100ML IV SOLN
10.0000 meq | INTRAVENOUS | Status: AC
  Administered 2024-05-27 (×6): 10 meq via INTRAVENOUS
  Filled 2024-05-27 (×6): qty 100

## 2024-05-27 MED ORDER — MELATONIN 3 MG PO TABS
3.0000 mg | ORAL_TABLET | Freq: Every evening | ORAL | Status: DC | PRN
  Administered 2024-05-30: 3 mg via ORAL
  Filled 2024-05-27: qty 1

## 2024-05-27 MED ORDER — PROCHLORPERAZINE EDISYLATE 10 MG/2ML IJ SOLN
5.0000 mg | Freq: Four times a day (QID) | INTRAMUSCULAR | Status: DC | PRN
  Administered 2024-05-28 – 2024-05-31 (×4): 5 mg via INTRAVENOUS
  Filled 2024-05-27 (×4): qty 2

## 2024-05-27 NOTE — Plan of Care (Signed)
   Problem: Health Behavior/Discharge Planning: Goal: Ability to manage health-related needs will improve Outcome: Progressing   Problem: Clinical Measurements: Goal: Respiratory complications will improve Outcome: Progressing Goal: Cardiovascular complication will be avoided Outcome: Progressing   Problem: Safety: Goal: Ability to remain free from injury will improve Outcome: Progressing

## 2024-05-27 NOTE — Progress Notes (Signed)
 Subjective: She still reports some nausea and vomiting with small amounts of PO intake.  Objective: Vital signs in last 24 hours: Temp:  [97.7 F (36.5 C)-98.1 F (36.7 C)] 97.8 F (36.6 C) (08/26 0943) Pulse Rate:  [48-52] 52 (08/26 0943) Resp:  [18-20] 19 (08/26 0943) BP: (148-171)/(74-92) 148/80 (08/26 0943) SpO2:  [97 %-99 %] 97 % (08/26 0600) Last BM Date : 05/25/24  Intake/Output from previous day: 08/25 0701 - 08/26 0700 In: 920 [P.O.:720; IV Piggyback:200] Out: -  Intake/Output this shift: Total I/O In: 593.3 [IV Piggyback:593.3] Out: -   General appearance: alert and no distress GI: mild LLQ abdominal pain  Lab Results: Recent Labs    05/25/24 0506 05/26/24 0539 05/27/24 0558  WBC 5.3 4.4 5.0  HGB 16.5* 16.5* 16.0*  HCT 48.2* 47.0* 46.2*  PLT 278 247 236   BMET Recent Labs    05/25/24 0506 05/26/24 0539 05/27/24 0558  NA 132* 131* 131*  K 3.2* 3.7 3.4*  CL 98 102 97*  CO2 21* 18* 21*  GLUCOSE 78 74 73  BUN <5* <5* <5*  CREATININE 0.85 0.73 0.79  CALCIUM 8.8* 8.8* 9.1   LFT No results for input(s): PROT, ALBUMIN, AST, ALT, ALKPHOS, BILITOT, BILIDIR, IBILI in the last 72 hours. PT/INR No results for input(s): LABPROT, INR in the last 72 hours. Hepatitis Panel No results for input(s): HEPBSAG, HCVAB, HEPAIGM, HEPBIGM in the last 72 hours. C-Diff No results for input(s): CDIFFTOX in the last 72 hours. Fecal Lactopherrin No results for input(s): FECLLACTOFRN in the last 72 hours.  Studies/Results: No results found.  Medications: Scheduled:  enoxaparin  (LOVENOX ) injection  40 mg Subcutaneous Q24H   losartan   25 mg Oral Daily   OLANZapine  zydis  5 mg Oral Daily   pantoprazole  (PROTONIX ) IV  40 mg Intravenous Q12H   scopolamine   1 patch Transdermal Q72H   Continuous:  potassium chloride  10 mEq (05/27/24 1410)    Assessment/Plan: 1) Nausea and vomiting. 2) Sigmoid diverticulitis.   Clinically she appears  stable, but she does report having persistent nausea and vomiting.  There were reports today that her symptoms were better.  It seems to be waxing and waning.  Plan: 1) Continue with scopolamine  patch and pantoprazole . 2) If the symptoms do not improve and EGD will be needed.  LOS: 6 days   Shelley [name] D 05/27/2024, 3:11 PM

## 2024-05-27 NOTE — Plan of Care (Signed)

## 2024-05-27 NOTE — Progress Notes (Signed)
  Progress Note  Patient Name: Shelley [name] Date of Encounter: 05/27/2024 [facility] Cardiologist: Maude Emmer, MD   Interval Summary   Improved N/V during the day yesterday, but reportedly had vomiting overnight. QT uncorrected slightly better at 560 ms, despite hypokalemia and hypomagnesemia.  Vital Signs Vitals:   05/26/24 1607 05/26/24 2027 05/26/24 2357 05/27/24 0600  BP: (!) 161/74 (!) 160/78 (!) 163/84 (!) 171/92  Pulse: (!) 48  (!) 48 (!) 48  Resp: 18 20 20 18   Temp: 97.8 F (36.6 C) 98 F (36.7 C) 97.7 F (36.5 C) 98.1 F (36.7 C)  TempSrc: Oral Oral Oral Oral  SpO2: 97% 99% 98% 97%  Weight:      Height:        Intake/Output Summary (Last 24 hours) at 05/27/2024 0827 Last data filed at 05/26/2024 1608 Gross per 24 hour  Intake 920 ml  Output --  Net 920 ml      05/24/2024   12:50 PM 05/20/2024   11:49 PM 05/12/2024    2:53 PM  Last 3 Weights  Weight (lbs) 182 lb 6.4 oz 182 lb 15.7 oz 160 lb  Weight (kg) 82.736 kg 83 kg 72.576 kg      Telemetry/ECG  Sinus bradycardia, very rare PVCs - Personally Reviewed  Physical Exam  GEN: No acute distress.  Sleeping. Neck: No JVD Cardiac: RRR, no murmurs, rubs, or gallops.  Respiratory: Clear to auscultation bilaterally. GI: Soft, nontender, non-distended  MS: No edema  Assessment & Plan   Continues to have marked ECG abnormalities in the setting of electrolyte imbalances and use of QT prolonging medications for intractable nausea and vomiting.  Some slight improvement based on telemetry, still waiting for the ECG. Try to keep magnesium  greater than 2.0, potassium greater than 4.0, avoid additional QT prolonging drugs BP is slightly elevated, but I think we can wait on addressing this until she is able to take oral medications.   For questions or updates, please contact [facility] Please consult [email or web address] for contact info under       Signed, Jerel Balding, MD

## 2024-05-27 NOTE — Progress Notes (Signed)
 PROGRESS NOTE    Shelley [name]  FMW:[id number] DOB: May 16, 1961 DOA: 05/20/2024 PCP: Ilah Crigler, MD   Brief Narrative:  This 63 years old female with history of deafness, sleep apnea, alcohol abuse who presented to the ED with complaints of nausea, vomiting, abdominal discomfort for 2 weeks. Had multiple ER visits for the same recently. On presentation, CT abdomen/pelvis showed acute sigmoid diverticulitis with increasing inflammatory stranding, started on antibiotics for diverticulitis. Cultures have not been sent. Hospital course remarkable for intractable nausea and vomiting.  Repeat CT abdomen shows no changes.  Her EKG also showed QT prolongation, bradycardia, ST changes so cardiology consulted. Due to her EKG changes, cardiology recommended to transfer to [facility].   Assessment & Plan:   Principal Problem:   Intractable nausea and vomiting Active Problems:   Abnormal ECG   Deafness   Alcohol abuse   Intractable vomiting   Diverticulitis of sigmoid colon   Acute sigmoid diverticulitis:  - Patient does not meet sepsis criteria -presented with nausea, vomiting, abdominal discomfort.   - CT confirmed acute sigmoid diverticulitis without perforation.  - Continue Antibiotics with ceftriaxone  and Flagyl  -Transiently worsening symptoms over the past 48 hours, repeat CT abdomen pelvis on 05/24/2024 without any notable changes, perforation or abscess formation. - Reglan  previously discontinued for prolonged Qtc.   Bradycardia/QT prolongation:  - Cardiology consulted given questionable ST changes and T wave inversion with concurrent long QT - Echo without notable abnormalities, EF 55% without notable diastolic or systolic dysfunction - Troponin flat, not consistent with ACS - Avoid QT prolonging medications. - She is getting a scopolamine  patch, Compazine  and Zyprexa  for severe vomiting. -Consider IM Tigan.   Intractable nausea : - Vomiting appears to be improving. - Continue scopolamine   patch, limited medication availability due to QT prolongation.   History of hypothyroidism: - Per chart review, no recent labs available, last in 2023 were within normal limits. - Repeat labs here remain within normal limits - Patient not currently on any supplementation, unclear if patient carries this diagnosis or if her prior episode of hypothyroidism was transient/provoked.   Hypokalemia/hypomagnesemia: Replaced.  Continue to monitor   Elevated lipase: Likely secondary to above - CT abdomen/pelvis x2 without overt inflammation or fluid collection near the pancreas.   Chronic alcohol abuse:  -Questionable diagnosis -last drink was weeks ago, no signs or symptoms of withdrawal -No indication for CIWA protocol at this time given length of time without alcohol -Continue vitamin supplementation with thiamine  and folic acid    Hypertension:  Reportedly not on antihypertensive at home. Trying to avoid nodal blocking agents due to bradycardia Losartan  added given borderline hypertension, avoid rate control medications due to ongoing bradycardia     DVT prophylaxis:Lovenox  Code Status: Full code Family Communication:No family at bed side Disposition Plan:    Status is: Inpatient Remains inpatient appropriate because: Severity of illness     Consultants:  Cardiology Gastroenterology  Procedures:  Antimicrobials: Anti-infectives (From admission, onward)    Start     Dose/Rate Route Frequency Ordered Stop   05/21/24 1000  metroNIDAZOLE  (FLAGYL ) IVPB 500 mg        500 mg 100 mL/hr over 60 Minutes Intravenous Every 12 hours 05/21/24 0832     05/21/24 1000  cefTRIAXone  (ROCEPHIN ) 2 g in sodium chloride  0.9 % 100 mL IVPB        2 g 200 mL/hr over 30 Minutes Intravenous Every 24 hours 05/21/24 0842     05/21/24 0930  ciprofloxacin  (CIPRO ) IVPB 400 mg  Status:  Discontinued        400 mg 200 mL/hr over 60 Minutes Intravenous Every 12 hours 05/21/24 9167 05/21/24 0841        Subjective: Patient was seen and examined at bedside. Overnight events noted. Language interpreter used for sign language.  All the patient's questions were answered. Patient reports still having Nausea and vomiting.  Objective: Vitals:   05/26/24 2027 05/26/24 2357 05/27/24 0600 05/27/24 0943  BP: (!) 160/78 (!) 163/84 (!) 171/92 (!) 148/80  Pulse:  (!) 48 (!) 48 (!) 52  Resp: 20 20 18 19   Temp: 98 F (36.7 C) 97.7 F (36.5 C) 98.1 F (36.7 C) 97.8 F (36.6 C)  TempSrc: Oral Oral Oral Oral  SpO2: 99% 98% 97%   Weight:      Height:        Intake/Output Summary (Last 24 hours) at 05/27/2024 1326 Last data filed at 05/26/2024 1608 Gross per 24 hour  Intake 320 ml  Output --  Net 320 ml   Filed Weights   05/20/24 2349 05/24/24 1250  Weight: 83 kg 82.7 kg    Examination:  General exam: Appears calm and comfortable, not in any acute distress. Respiratory system: CTA Bilaterally. Respiratory effort normal.  RR 16 Cardiovascular system: S1 & S2 heard, RRR. No JVD, murmurs, rubs, gallops or clicks.  Gastrointestinal system: Abdomen is nondistended, soft and nontender.  Normal bowel sounds heard. Central nervous system: Alert and oriented x 3. No focal neurological deficits. Extremities: No edema, no cyanosis, no clubbing Skin: No rashes, lesions or ulcers Psychiatry: Judgement and insight appear normal. Mood & affect appropriate.    Data Reviewed: I have personally reviewed following labs and imaging studies  CBC: Recent Labs  Lab 05/20/24 1740 05/21/24 0444 05/23/24 0441 05/24/24 0546 05/25/24 0506 05/26/24 0539 05/27/24 0558  WBC 5.7   < > 5.5 4.9 5.3 4.4 5.0  NEUTROABS 3.7  --   --   --   --   --   --   HGB 18.2*   < > 16.2* 15.2* 16.5* 16.5* 16.0*  HCT 54.5*   < > 47.7* 47.0* 48.2* 47.0* 46.2*  MCV 89.9   < > 91.0 92.3 88.6 88.3 88.3  PLT 327   < > 269 253 278 247 236   < > = values in this interval not displayed.   Basic Metabolic Panel: Recent Labs   Lab 05/21/24 0444 05/22/24 0459 05/23/24 0441 05/23/24 1722 05/24/24 0546 05/25/24 0506 05/26/24 0539 05/27/24 0558  NA 132* 134* 134* 136 136 132* 131* 131*  K 3.4* 3.2* 3.1* 3.6 3.7 3.2* 3.7 3.4*  CL 97* 100 99 100 102 98 102 97*  CO2 20* 23 23 20* 22 21* 18* 21*  GLUCOSE 81 86 84 88 80 78 74 73  BUN 8 <5* <5* <5* <5* <5* <5* <5*  CREATININE 0.71 0.61 0.63 0.70 0.72 0.85 0.73 0.79  CALCIUM 8.9 8.8* 8.8* 8.9 8.7* 8.8* 8.8* 9.1  MG 2.1 1.6* 2.0  --  1.8  --   --  1.5*  PHOS 3.6 3.0 3.1  --  2.9  --   --  2.9   GFR: Estimated Creatinine Clearance: 79.1 mL/min (by C-G formula based on SCr of 0.79 mg/dL). Liver Function Tests: Recent Labs  Lab 05/20/24 1740 05/21/24 0444 05/24/24 0546  AST 26 17 19   ALT 25 21 16   ALKPHOS 85 71 54  BILITOT 1.4* 1.1 0.9  PROT 7.4 6.1* 5.6*  ALBUMIN 3.7 3.0* 2.9*   Recent Labs  Lab 05/21/24 0444 05/23/24 1721 05/24/24 0546 05/25/24 0506 05/26/24 0539  LIPASE 108* 91* 124* 126* 115*   No results for input(s): AMMONIA in the last 168 hours. Coagulation Profile: Recent Labs  Lab 05/21/24 0444  INR 1.0   Cardiac Enzymes: No results for input(s): CKTOTAL, CKMB, CKMBINDEX, TROPONINI in the last 168 hours. BNP (last 3 results) No results for input(s): PROBNP in the last 8760 hours. HbA1C: No results for input(s): HGBA1C in the last 72 hours. CBG: Recent Labs  Lab 05/23/24 0002 05/23/24 0731 05/23/24 1616 05/24/24 0029 05/24/24 0735  GLUCAP 110* 96 94 88 88   Lipid Profile: No results for input(s): CHOL, HDL, LDLCALC, TRIG, CHOLHDL, LDLDIRECT in the last 72 hours. Thyroid  Function Tests: Recent Labs    05/25/24 0857  TSH 0.692  FREET4 1.06   Anemia Panel: No results for input(s): VITAMINB12, FOLATE, FERRITIN, TIBC, IRON, RETICCTPCT in the last 72 hours. Sepsis Labs: No results for input(s): PROCALCITON, LATICACIDVEN in the last 168 hours.  No results found for this or any  previous visit (from the past 240 hours).   Radiology Studies: No results found.  Scheduled Meds:  enoxaparin  (LOVENOX ) injection  40 mg Subcutaneous Q24H   losartan   25 mg Oral Daily   OLANZapine  zydis  5 mg Oral Daily   pantoprazole  (PROTONIX ) IV  40 mg Intravenous Q12H   scopolamine   1 patch Transdermal Q72H   Continuous Infusions:  cefTRIAXone  (ROCEPHIN )  IV 2 g (05/27/24 0859)   metronidazole  500 mg (05/27/24 0857)   potassium chloride  10 mEq (05/27/24 1314)     LOS: 6 days    Time spent: 35 mins    Darcel Dawley, MD [facility]   If 7PM-7AM, please contact night-coverage

## 2024-05-28 DIAGNOSIS — R001 Bradycardia, unspecified: Secondary | ICD-10-CM | POA: Diagnosis not present

## 2024-05-28 DIAGNOSIS — R112 Nausea with vomiting, unspecified: Secondary | ICD-10-CM | POA: Diagnosis not present

## 2024-05-28 LAB — BASIC METABOLIC PANEL WITH GFR
Anion gap: 9 (ref 5–15)
BUN: <5 mg/dL — ABNORMAL LOW (ref 8–23)
CO2: 21 mmol/L — ABNORMAL LOW (ref 22–32)
Calcium: 8.9 mg/dL (ref 8.9–10.3)
Chloride: 100 mmol/L (ref 98–111)
Creatinine, Ser: 0.71 mg/dL (ref 0.44–1.00)
GFR, Estimated: >60 mL/min (ref >60)
Glucose, Bld: 80 mg/dL (ref 70–99)
Potassium: 3.5 mmol/L (ref 3.5–5.1)
Sodium: 130 mmol/L — ABNORMAL LOW (ref 135–145)

## 2024-05-28 LAB — MAGNESIUM: Magnesium: 1.8 mg/dL (ref 1.7–2.4)

## 2024-05-28 MED ORDER — POTASSIUM CHLORIDE 10 MEQ/100ML IV SOLN
10.0000 meq | INTRAVENOUS | Status: AC
  Administered 2024-05-28 (×6): 10 meq via INTRAVENOUS
  Filled 2024-05-28 (×6): qty 100

## 2024-05-28 MED ORDER — MAGNESIUM SULFATE 2 GM/50ML IV SOLN
2.0000 g | Freq: Once | INTRAVENOUS | Status: AC
  Administered 2024-05-28: 2 g via INTRAVENOUS
  Filled 2024-05-28: qty 50

## 2024-05-28 MED ORDER — SODIUM CHLORIDE 0.9 % IV SOLN: INTRAVENOUS | Status: DC

## 2024-05-28 NOTE — H&P (View-Only) (Signed)
 Subjective: Since I last evaluated the patient, she has had slight improvement in the left lower quadrant pain but she is still has had nausea and vomiting and as per her nurse she vomited a large amount of undigested food this morning. Objective: Vital signs in last 24 hours: Temp:  [97.7 F (36.5 C)-98.2 F (36.8 C)] 97.7 F (36.5 C) (08/27 0420) Pulse Rate:  [48-54] 49 (08/27 0420) Resp:  [16-19] 19 (08/27 0420) BP: (148-171)/(80-92) 161/87 (08/27 0420) SpO2:  [98 %] 98 % (08/27 0420) Last BM Date : 05/27/24  Intake/Output from previous day: 08/26 0701 - 08/27 0700 In: 593.3 [IV Piggyback:593.3] Out: -  Intake/Output this shift: No intake/output data recorded.  General appearance: alert, cooperative, fatigued, and no distress Resp: clear to auscultation bilaterally Cardio: regular rate and rhythm, S1, S2 normal, no murmur, click, rub or gallop GI: soft, non-tender; bowel sounds normal; no masses,  no organomegaly  Lab Results: Recent Labs    05/26/24 0539 05/27/24 0558  WBC 4.4 5.0  HGB 16.5* 16.0*  HCT 47.0* 46.2*  PLT 247 236   BMET Recent Labs    05/26/24 0539 05/27/24 0558 05/28/24 0401  NA 131* 131* 130*  K 3.7 3.4* 3.5  CL 102 97* 100  CO2 18* 21* 21*  GLUCOSE 74 73 80  BUN <5* <5* <5*  CREATININE 0.73 0.79 0.71  CALCIUM 8.8* 9.1 8.9   LStudies/Results: No results found.  Medications: I have reviewed the patient's current medications. Prior to Admission:  Medications Prior to Admission  Medication Sig Dispense Refill Last Dose/Taking   metoCLOPramide  (REGLAN ) 10 MG tablet Take 1 tablet (10 mg total) by mouth every 8 (eight) hours as needed for nausea or vomiting. 30 tablet 0 05/20/2024   pantoprazole  (PROTONIX ) 20 MG tablet Take 1 tablet (20 mg total) by mouth daily. 30 tablet 2 Past Week   potassium chloride  SA (KLOR-CON  M) 20 MEQ tablet Take 2 tablets (40 mEq total) by mouth daily for 5 days. (Patient not taking: Reported on 05/21/2024) 10 tablet 0  Not Taking   sucralfate  (CARAFATE ) 1 g tablet Take 1 tablet (1 g total) by mouth 4 (four) times daily -  with meals and at bedtime. (Patient not taking: Reported on 05/21/2024) 120 tablet 0 Not Taking   Scheduled:  enoxaparin  (LOVENOX ) injection  40 mg Subcutaneous Q24H   losartan   25 mg Oral Daily   OLANZapine  zydis  5 mg Oral Daily   pantoprazole  (PROTONIX ) IV  40 mg Intravenous Q12H   scopolamine   1 patch Transdermal Q72H   Continuous: PRN:hydrALAZINE , melatonin, mouth rinse, polyethylene glycol, prochlorperazine   Assessment/Plan: 1 Acute sigmoid diverticulitis on ceftriaxone  and Flagyl . 2) Bradycardia with prolonged QT interval. 3) Intractable nausea and vomiting-she may benefit from an EGD-Dr. Rollin to decide tomorrow.  4) Chronic alcohol abuse 5) fatty infiltration of the liver. 6) Cholelithiasis which may be causing some of the nausea and vomiting she is experiencing.  She needs to keep a track of her symptoms if her symptoms are worse after meals and with fatty foods. 7) Hypertension 8) Hypothyroidism. 9) Fat containing umbilical hernia. 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 11) Obesity. 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  LOS: 7 days   Renaye Sous 05/28/2024, 6:57 AM

## 2024-05-28 NOTE — Plan of Care (Signed)
  Problem: Clinical Measurements: Goal: Respiratory complications will improve Outcome: Progressing Goal: Cardiovascular complication will be avoided Outcome: Progressing   Problem: Activity: Goal: Risk for activity intolerance will decrease Outcome: Progressing   Problem: Safety: Goal: Ability to remain free from injury will improve Outcome: Progressing   

## 2024-05-28 NOTE — Progress Notes (Signed)
 PROGRESS NOTE    Shelley [name]  FMW:[id number] DOB: 1961-01-05 DOA: 05/20/2024 PCP: Ilah Crigler, MD   Brief Narrative:  This 63 years old female with history of deafness, sleep apnea, alcohol abuse who presented to the ED with complaints of nausea, vomiting, abdominal discomfort for 2 weeks. Had multiple ER visits for the same recently. On presentation, CT abdomen/pelvis showed acute sigmoid diverticulitis with increasing inflammatory stranding, started on antibiotics for diverticulitis. Cultures have not been sent. Hospital course remarkable for intractable nausea and vomiting.  Repeat CT abdomen shows no changes.  Her EKG also showed QT prolongation, bradycardia, ST changes so cardiology consulted. Due to her EKG changes, cardiology recommended to transfer to [facility].   Assessment & Plan:   Principal Problem:   Intractable nausea and vomiting Active Problems:   Abnormal ECG   Deafness   Alcohol abuse   Intractable vomiting   Diverticulitis of sigmoid colon   Acute sigmoid diverticulitis:  - Patient does not meet sepsis criteria -presented with nausea, vomiting, abdominal discomfort.   - CT confirmed acute sigmoid diverticulitis without perforation.  - Continue Antibiotics with ceftriaxone  and Flagyl  -Transiently worsening symptoms over the past 48 hours, repeat CT abdomen pelvis on 05/24/2024 without any notable changes, perforation or abscess formation. - Reglan  previously discontinued for prolonged Qtc.   Bradycardia/QT prolongation:  - Cardiology consulted given questionable ST changes and T wave inversion with concurrent long QT - Echo without notable abnormalities, EF 55% without notable diastolic or systolic dysfunction - Troponin flat, not consistent with ACS - Avoid QT prolonging medications. - She is getting a scopolamine  patch, Compazine  and Zyprexa  for severe vomiting. -Consider IM Tigan. - QT Prolongation has significantly improved. -Cardiology recommended referral for  genetic testing for long QT syndrome   Intractable nausea : - Vomiting appears to be improving. - Continue scopolamine  patch, limited medication availability due to QT prolongation. -If patient continue to have nausea and vomiting,  GI is planning for EGD tomorrow   History of hypothyroidism: - Per chart review, no recent labs available, last in 2023 were within normal limits. - Repeat labs here remain within normal limits - Patient not currently on any supplementation, unclear if patient carries this diagnosis or if her prior episode of hypothyroidism was transient/provoked.   Hypokalemia/hypomagnesemia: Replaced.  Continue to monitor   Elevated lipase: Likely secondary to above - CT abdomen/pelvis x2 without overt inflammation or fluid collection near the pancreas.   Chronic alcohol abuse:  -Questionable diagnosis -last drink was weeks ago, no signs or symptoms of withdrawal -No indication for CIWA protocol at this time given length of time without alcohol -Continue vitamin supplementation with thiamine  and folic acid    Hypertension:  Reportedly not on antihypertensive at home. Trying to avoid nodal blocking agents due to bradycardia Losartan  added given borderline hypertension, avoid rate control medications due to ongoing bradycardia     DVT prophylaxis:Lovenox  Code Status: Full code Family Communication:No family at bed side Disposition Plan:    Status is: Inpatient Remains inpatient appropriate because: Severity of illness.  Patient continues to have intractable nausea and vomiting.  She is planning for EGD.     Consultants:  Cardiology Gastroenterology  Procedures:  Antimicrobials: Anti-infectives (From admission, onward)    Start     Dose/Rate Route Frequency Ordered Stop   05/21/24 1000  metroNIDAZOLE  (FLAGYL ) IVPB 500 mg  Status:  Discontinued        500 mg 100 mL/hr over 60 Minutes Intravenous Every 12 hours 05/21/24 0832 05/27/24 1352  05/21/24 1000   cefTRIAXone  (ROCEPHIN ) 2 g in sodium chloride  0.9 % 100 mL IVPB  Status:  Discontinued        2 g 200 mL/hr over 30 Minutes Intravenous Every 24 hours 05/21/24 0842 05/27/24 1352   05/21/24 0930  ciprofloxacin  (CIPRO ) IVPB 400 mg  Status:  Discontinued        400 mg 200 mL/hr over 60 Minutes Intravenous Every 12 hours 05/21/24 9167 05/21/24 0841       Subjective: Patient was seen and examined at bedside. Overnight events noted. Language interpreter used for sign language.  All the patient's questions were answered. Patient still reports having nausea and vomiting. QT interval has improved  Objective: Vitals:   05/27/24 2338 05/28/24 0420 05/28/24 0850 05/28/24 1239  BP: (!) 171/88 (!) 161/87 (!) 147/94 (!) 155/86  Pulse: (!) 48 (!) 49 70 (!) 53  Resp: 18 19 18 18   Temp: 98.2 F (36.8 C) 97.7 F (36.5 C) 97.8 F (36.6 C) 98 F (36.7 C)  TempSrc: Oral Oral Oral Oral  SpO2:  98% 94% 95%  Weight:      Height:        Intake/Output Summary (Last 24 hours) at 05/28/2024 1343 Last data filed at 05/27/2024 1411 Gross per 24 hour  Intake 593.33 ml  Output --  Net 593.33 ml   Filed Weights   05/20/24 2349 05/24/24 1250  Weight: 83 kg 82.7 kg    Examination:  General exam: Appears calm and comfortable, not in any acute distress. Respiratory system: CTA Bilaterally. Respiratory effort normal.  RR 14 Cardiovascular system: S1 & S2 heard, RRR. No JVD, murmurs, rubs, gallops or clicks.  Gastrointestinal system: Abdomen is nondistended, soft and nontender.  Normal bowel sounds heard. Central nervous system: Alert and oriented x 3. No focal neurological deficits. Extremities: No edema, no cyanosis, no clubbing Skin: No rashes, lesions or ulcers Psychiatry: Judgement and insight appear normal. Mood & affect appropriate.    Data Reviewed: I have personally reviewed following labs and imaging studies  CBC: Recent Labs  Lab 05/23/24 0441 05/24/24 0546 05/25/24 0506  05/26/24 0539 05/27/24 0558  WBC 5.5 4.9 5.3 4.4 5.0  HGB 16.2* 15.2* 16.5* 16.5* 16.0*  HCT 47.7* 47.0* 48.2* 47.0* 46.2*  MCV 91.0 92.3 88.6 88.3 88.3  PLT 269 253 278 247 236   Basic Metabolic Panel: Recent Labs  Lab 05/22/24 0459 05/23/24 0441 05/23/24 1722 05/24/24 0546 05/25/24 0506 05/26/24 0539 05/27/24 0558 05/28/24 0401  NA 134* 134*   < > 136 132* 131* 131* 130*  K 3.2* 3.1*   < > 3.7 3.2* 3.7 3.4* 3.5  CL 100 99   < > 102 98 102 97* 100  CO2 23 23   < > 22 21* 18* 21* 21*  GLUCOSE 86 84   < > 80 78 74 73 80  BUN <5* <5*   < > <5* <5* <5* <5* <5*  CREATININE 0.61 0.63   < > 0.72 0.85 0.73 0.79 0.71  CALCIUM 8.8* 8.8*   < > 8.7* 8.8* 8.8* 9.1 8.9  MG 1.6* 2.0  --  1.8  --   --  1.5* 1.8  PHOS 3.0 3.1  --  2.9  --   --  2.9  --    < > = values in this interval not displayed.   GFR: Estimated Creatinine Clearance: 79.1 mL/min (by C-G formula based on SCr of 0.71 mg/dL). Liver Function Tests: Recent Labs  Lab  05/24/24 0546  AST 19  ALT 16  ALKPHOS 54  BILITOT 0.9  PROT 5.6*  ALBUMIN 2.9*   Recent Labs  Lab 05/23/24 1721 05/24/24 0546 05/25/24 0506 05/26/24 0539  LIPASE 91* 124* 126* 115*   No results for input(s): AMMONIA in the last 168 hours. Coagulation Profile: No results for input(s): INR, PROTIME in the last 168 hours.  Cardiac Enzymes: No results for input(s): CKTOTAL, CKMB, CKMBINDEX, TROPONINI in the last 168 hours. BNP (last 3 results) No results for input(s): PROBNP in the last 8760 hours. HbA1C: No results for input(s): HGBA1C in the last 72 hours. CBG: Recent Labs  Lab 05/23/24 0002 05/23/24 0731 05/23/24 1616 05/24/24 0029 05/24/24 0735  GLUCAP 110* 96 94 88 88   Lipid Profile: No results for input(s): CHOL, HDL, LDLCALC, TRIG, CHOLHDL, LDLDIRECT in the last 72 hours. Thyroid  Function Tests: No results for input(s): TSH, T4TOTAL, FREET4, T3FREE, THYROIDAB in the last 72  hours.  Anemia Panel: No results for input(s): VITAMINB12, FOLATE, FERRITIN, TIBC, IRON, RETICCTPCT in the last 72 hours. Sepsis Labs: No results for input(s): PROCALCITON, LATICACIDVEN in the last 168 hours.  No results found for this or any previous visit (from the past 240 hours).   Radiology Studies: No results found.  Scheduled Meds:  enoxaparin  (LOVENOX ) injection  40 mg Subcutaneous Q24H   losartan   25 mg Oral Daily   OLANZapine  zydis  5 mg Oral Daily   pantoprazole  (PROTONIX ) IV  40 mg Intravenous Q12H   scopolamine   1 patch Transdermal Q72H   Continuous Infusions:  magnesium  sulfate bolus IVPB     potassium chloride        LOS: 7 days    Time spent: 35 mins    Darcel Dawley, MD [facility]   If 7PM-7AM, please contact night-coverage

## 2024-05-28 NOTE — Progress Notes (Signed)
 ECG yesterday was still markedly abnormal. Today, there is marked improvement in QTc 480 ms today, after improved bradycardia, improved electrolytes and cessation of most of the QT prolonging agents. K still borderline low. Will sign off. Continue to avoid QT prolonging agents, keep K > 4 and Mg > 2. Please re-consult if needed. After DC, recommend referral for genetic testing for LQT syndrome.

## 2024-05-28 NOTE — Progress Notes (Signed)
 Subjective: Since I last evaluated the patient, she has had slight improvement in the left lower quadrant pain but she is still has had nausea and vomiting and as per her nurse she vomited a large amount of undigested food this morning. Objective: Vital signs in last 24 hours: Temp:  [97.7 F (36.5 C)-98.2 F (36.8 C)] 97.7 F (36.5 C) (08/27 0420) Pulse Rate:  [48-54] 49 (08/27 0420) Resp:  [16-19] 19 (08/27 0420) BP: (148-171)/(80-92) 161/87 (08/27 0420) SpO2:  [98 %] 98 % (08/27 0420) Last BM Date : 05/27/24  Intake/Output from previous day: 08/26 0701 - 08/27 0700 In: 593.3 [IV Piggyback:593.3] Out: -  Intake/Output this shift: No intake/output data recorded.  General appearance: alert, cooperative, fatigued, and no distress Resp: clear to auscultation bilaterally Cardio: regular rate and rhythm, S1, S2 normal, no murmur, click, rub or gallop GI: soft, non-tender; bowel sounds normal; no masses,  no organomegaly  Lab Results: Recent Labs    05/26/24 0539 05/27/24 0558  WBC 4.4 5.0  HGB 16.5* 16.0*  HCT 47.0* 46.2*  PLT 247 236   BMET Recent Labs    05/26/24 0539 05/27/24 0558 05/28/24 0401  NA 131* 131* 130*  K 3.7 3.4* 3.5  CL 102 97* 100  CO2 18* 21* 21*  GLUCOSE 74 73 80  BUN <5* <5* <5*  CREATININE 0.73 0.79 0.71  CALCIUM 8.8* 9.1 8.9   LStudies/Results: No results found.  Medications: I have reviewed the patient's current medications. Prior to Admission:  Medications Prior to Admission  Medication Sig Dispense Refill Last Dose/Taking   metoCLOPramide  (REGLAN ) 10 MG tablet Take 1 tablet (10 mg total) by mouth every 8 (eight) hours as needed for nausea or vomiting. 30 tablet 0 05/20/2024   pantoprazole  (PROTONIX ) 20 MG tablet Take 1 tablet (20 mg total) by mouth daily. 30 tablet 2 Past Week   potassium chloride  SA (KLOR-CON  M) 20 MEQ tablet Take 2 tablets (40 mEq total) by mouth daily for 5 days. (Patient not taking: Reported on 05/21/2024) 10 tablet 0  Not Taking   sucralfate  (CARAFATE ) 1 g tablet Take 1 tablet (1 g total) by mouth 4 (four) times daily -  with meals and at bedtime. (Patient not taking: Reported on 05/21/2024) 120 tablet 0 Not Taking   Scheduled:  enoxaparin  (LOVENOX ) injection  40 mg Subcutaneous Q24H   losartan   25 mg Oral Daily   OLANZapine  zydis  5 mg Oral Daily   pantoprazole  (PROTONIX ) IV  40 mg Intravenous Q12H   scopolamine   1 patch Transdermal Q72H   Continuous: PRN:hydrALAZINE , melatonin, mouth rinse, polyethylene glycol, prochlorperazine   Assessment/Plan: 1 Acute sigmoid diverticulitis on ceftriaxone  and Flagyl . 2) Bradycardia with prolonged QT interval. 3) Intractable nausea and vomiting-she may benefit from an EGD-Dr. Rollin to decide tomorrow.  4) Chronic alcohol abuse 5) fatty infiltration of the liver. 6) Cholelithiasis which may be causing some of the nausea and vomiting she is experiencing.  She needs to keep a track of her symptoms if her symptoms are worse after meals and with fatty foods. 7) Hypertension 8) Hypothyroidism. 9) Fat containing umbilical hernia. 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 11) Obesity. 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  LOS: 7 days   Shelley [name] 05/28/2024, 6:57 AM

## 2024-05-29 ENCOUNTER — Inpatient Hospital Stay (HOSPITAL_COMMUNITY): Admitting: Anesthesiology

## 2024-05-29 ENCOUNTER — Encounter (HOSPITAL_COMMUNITY)
Admission: EM | Disposition: A | Discharge status: Home / Self Care | Payer: Self-pay | Source: Home / Self Care | Attending: Family Medicine

## 2024-05-29 DIAGNOSIS — K269 Duodenal ulcer, unspecified as acute or chronic, without hemorrhage or perforation: Secondary | ICD-10-CM

## 2024-05-29 DIAGNOSIS — R112 Nausea with vomiting, unspecified: Secondary | ICD-10-CM | POA: Diagnosis not present

## 2024-05-29 DIAGNOSIS — G4730 Sleep apnea, unspecified: Secondary | ICD-10-CM

## 2024-05-29 DIAGNOSIS — Z6829 Body mass index (BMI) 29.0-29.9, adult: Secondary | ICD-10-CM

## 2024-05-29 HISTORY — PX: ESOPHAGOGASTRODUODENOSCOPY: SHX5428

## 2024-05-29 LAB — BASIC METABOLIC PANEL WITH GFR
Anion gap: 10 (ref 5–15)
BUN: <5 mg/dL — ABNORMAL LOW (ref 8–23)
CO2: 23 mmol/L (ref 22–32)
Calcium: 9 mg/dL (ref 8.9–10.3)
Chloride: 99 mmol/L (ref 98–111)
Creatinine, Ser: 0.67 mg/dL (ref 0.44–1.00)
GFR, Estimated: >60 mL/min (ref >60)
Glucose, Bld: 78 mg/dL (ref 70–99)
Potassium: 4 mmol/L (ref 3.5–5.1)
Sodium: 132 mmol/L — ABNORMAL LOW (ref 135–145)

## 2024-05-29 LAB — MAGNESIUM: Magnesium: 1.8 mg/dL (ref 1.7–2.4)

## 2024-05-29 SURGERY — EGD (ESOPHAGOGASTRODUODENOSCOPY)
Anesthesia: Monitor Anesthesia Care

## 2024-05-29 MED ORDER — MAGNESIUM SULFATE 4 GM/100ML IV SOLN
4.0000 g | Freq: Once | INTRAVENOUS | Status: AC
  Administered 2024-05-29: 4 g via INTRAVENOUS
  Filled 2024-05-29: qty 100

## 2024-05-29 MED ORDER — PROPOFOL 500 MG/50ML IV EMUL
INTRAVENOUS | Status: DC | PRN
  Administered 2024-05-29: 150 ug/kg/min via INTRAVENOUS

## 2024-05-29 MED ORDER — LIDOCAINE 2% (20 MG/ML) 5 ML SYRINGE
INTRAMUSCULAR | Status: DC | PRN
  Administered 2024-05-29: 60 mg via INTRAVENOUS

## 2024-05-29 MED ORDER — SUCRALFATE 1 G PO TABS
1.0000 g | ORAL_TABLET | Freq: Three times a day (TID) | ORAL | Status: DC
  Administered 2024-05-29 – 2024-05-31 (×8): 1 g via ORAL
  Filled 2024-05-29 (×8): qty 1

## 2024-05-29 MED ORDER — OXYCODONE HCL 5 MG/5ML PO SOLN: 5.0000 mg | Freq: Once | ORAL | Status: DC | PRN

## 2024-05-29 MED ORDER — PROPOFOL 10 MG/ML IV BOLUS
INTRAVENOUS | Status: DC | PRN
  Administered 2024-05-29: 30 mg via INTRAVENOUS

## 2024-05-29 MED ORDER — OXYCODONE HCL 5 MG PO TABS: 5.0000 mg | ORAL_TABLET | Freq: Once | ORAL | Status: DC | PRN

## 2024-05-29 MED ORDER — FENTANYL CITRATE (PF) 100 MCG/2ML IJ SOLN: 25.0000 ug | INTRAMUSCULAR | Status: DC | PRN

## 2024-05-29 NOTE — TOC Initial Note (Signed)
 Transition of Care [facility]) - Initial/Assessment Note    Patient Details  Name: Shelley [name] MRN: [id number] Date of Birth: 23-Jan-1961  Transition of Care [facility]) CM/SW Contact:    Sudie Erminio Deems, RN Phone Number: 05/29/2024, 3:33 PM  Clinical Narrative: Patient presented for intractable vomiting. Interpreter Richardson at the bedside. PTA patient was from home alone. Patient states she has support of niece and sister if needed. Patient has PCP and her niece takes her to appointments. Patient has no DME in the home. No further needs identified at this time.       Expected Discharge Plan: Home/Self Care Barriers to Discharge: No Barriers Identified   Patient Goals and CMS Choice Patient states their goals for this hospitalization and ongoing recovery are:: plans to return home once stabe.   Choice offered to / list presented to : NA      Expected Discharge Plan and Services In-house Referral: NA Discharge Planning Services: CM Consult Post Acute Care Choice: NA Living arrangements for the past 2 months: Apartment                   DME Agency: NA       HH Arranged: NA  Prior Living Arrangements/Services Living arrangements for the past 2 months: Apartment Lives with:: Self (has support of niece and sister.) Patient language and need for interpreter reviewed:: Yes (Interpreter Richardson in the room at the bedside.) Do you feel safe going back to the place where you live?: Yes      Need for Family Participation in Patient Care: No (Comment) Care giver support system in place?: No (comment)   Criminal Activity/Legal Involvement Pertinent to Current Situation/Hospitalization: No - Comment as needed  Activities of Daily Living   ADL Screening (condition at time of admission) Independently performs ADLs?: Yes (appropriate for developmental age) Is the patient deaf or have difficulty hearing?: Yes Does the patient have difficulty seeing, even when wearing glasses/contacts?:  No Does the patient have difficulty concentrating, remembering, or making decisions?: No  Permission Sought/Granted Permission sought to share information with : Family Supports, Case Manager    Emotional Assessment Appearance:: Appears stated age Attitude/Demeanor/Rapport: Engaged Affect (typically observed): Appropriate Orientation: : Oriented to Self, Oriented to Place, Oriented to  Time Alcohol / Substance Use: Not Applicable Psych Involvement: No (comment)  Admission diagnosis:  Intractable vomiting [R11.10] Nausea and vomiting, unspecified vomiting type [R11.2] Diverticulitis of sigmoid colon [K57.32] Patient Active Problem List   Diagnosis Date Noted   Diverticulitis of sigmoid colon 05/21/2024   Intractable nausea and vomiting 05/20/2024   Intractable vomiting 05/20/2024   Malnutrition of moderate degree 01/09/2022   Folate deficiency 01/08/2022   Hypoglycemia 01/08/2022   Abnormal ECG 01/07/2022   Deafness 01/07/2022   Hypokalemia 01/07/2022   Diverticulosis of colon 01/07/2022   Family history of malignant neoplasm of digestive organs 01/07/2022   Morbid obesity (HCC) 01/07/2022   Weight loss 01/07/2022   Chronic vomiting 01/07/2022   Alcohol abuse 01/07/2022   Acute pancreatitis 01/06/2022   Sleep apnea 10/24/2012   Screening for cervical cancer 09/12/2012   Need for Tdap vaccination 09/12/2012   Routine general medical examination at a health care facility 09/12/2012   Other screening mammogram 09/12/2012   Thyromegaly 09/12/2012   PCP:  Ilah Crigler, MD Pharmacy:   [id number] [place],  - [address] AT [facility] & [address] [place] [address] Phone:  [phone] Fax: [phone]  [id number] [place], [place] - [address] AT [facility] [address] [place] [address] Phone: [phone] Fax: [phone]  [facility] [address] [place] [address] Phone: [phone] Fax: [phone]  Social Drivers of Health (SDOH) Social History: SDOH Screenings   Food Insecurity: No Food Insecurity (05/21/2024)  Housing: Low Risk  (05/21/2024)  Transportation Needs: No Transportation Needs (05/21/2024)  Utilities: Not At Risk (05/21/2024)  Tobacco Use: High Risk (05/20/2024)   Readmission Risk Interventions    05/22/2024   11:08 AM  Readmission Risk Prevention Plan  Post Dischage Appt Complete  Medication Screening Complete  Transportation Screening Complete

## 2024-05-29 NOTE — Interval H&P Note (Signed)
 History and Physical Interval Note:  05/29/2024 2:00 PM  Shelley [name]  has presented today for surgery, with the diagnosis of Nausea/vomiting.  The various methods of treatment have been discussed with the patient and family. After consideration of risks, benefits and other options for treatment, the patient has consented to  Procedure(s): EGD (ESOPHAGOGASTRODUODENOSCOPY) (N/A) as a surgical intervention.  The patient's history has been reviewed, patient examined, no change in status, stable for surgery.  I have reviewed the patient's chart and labs.  Questions were answered to the patient's satisfaction.     Arthur Aydelotte D

## 2024-05-29 NOTE — Plan of Care (Signed)

## 2024-05-29 NOTE — Anesthesia Preprocedure Evaluation (Addendum)
 Anesthesia Evaluation  Patient identified by MRN, date of birth, ID band  Reviewed: Allergy & Precautions, NPO status , Patient's Chart, lab work & pertinent test results  History of Anesthesia Complications Negative for: history of anesthetic complications  Airway Mallampati: II  TM Distance: >3 FB     Dental no notable dental hx.    Pulmonary sleep apnea and Continuous Positive Airway Pressure Ventilation , neg COPD, Current Smoker   breath sounds clear to auscultation       Cardiovascular (-) angina (-) CAD, (-) Past MI and (-) Cardiac Stents negative cardio ROS  Rhythm:Regular Rate:Normal  TTE 2025 1. Left ventricular ejection fraction, by estimation, is 55%. The left  ventricle has low normal function. The left ventricle has no regional wall  motion abnormalities. There is mild left ventricular hypertrophy of the  basal-septal segment. Left  ventricular diastolic parameters were normal.   2. Right ventricular systolic function is normal. The right ventricular  size is normal.   3. The mitral valve is grossly normal. Trivial mitral valve  regurgitation. No evidence of mitral stenosis.   4. The aortic valve is tricuspid. Aortic valve regurgitation is not  visualized. No aortic stenosis is present.   5. The inferior vena cava is normal in size with greater than 50%  respiratory variability, suggesting right atrial pressure of 3 mmHg.     Neuro/Psych neg Seizures PSYCHIATRIC DISORDERS      negative neurological ROS     GI/Hepatic negative GI ROS,neg GERD  ,,(+)     substance abuse  alcohol useSigmoid diverticulitis    Endo/Other  negative endocrine ROS    Renal/GU negative Renal ROS  negative genitourinary   Musculoskeletal negative musculoskeletal ROS (+)    Abdominal   Peds  Hematology negative hematology ROS (+)   Anesthesia Other Findings 63 years old female with history of deafness, sleep apnea, alcohol  abuse who presented to the ED with complaints of nausea, vomiting, abdominal discomfort for 2 weeks  Reproductive/Obstetrics                              Anesthesia Physical Anesthesia Plan  ASA: 3  Anesthesia Plan: MAC   Post-op Pain Management:    Induction: Intravenous  PONV Risk Score and Plan: Propofol  infusion and Treatment may vary due to age or medical condition  Airway Management Planned: Natural Airway  Additional Equipment:   Intra-op Plan:   Post-operative Plan:   Informed Consent: I have reviewed the patients History and Physical, chart, labs and discussed the procedure including the risks, benefits and alternatives for the proposed anesthesia with the patient or authorized representative who has indicated his/her understanding and acceptance.     Dental advisory given and Interpreter used for interview  Plan Discussed with: CRNA  Anesthesia Plan Comments:          Anesthesia Quick Evaluation

## 2024-05-29 NOTE — Anesthesia Postprocedure Evaluation (Signed)
 Anesthesia Post Note  Patient: [name]  Procedure(s) Performed: EGD (ESOPHAGOGASTRODUODENOSCOPY)     Patient location during evaluation: PACU Anesthesia Type: MAC Level of consciousness: awake and alert Pain management: pain level controlled Vital Signs Assessment: post-procedure vital signs reviewed and stable Respiratory status: spontaneous breathing, nonlabored ventilation, respiratory function stable and patient connected to nasal cannula oxygen Cardiovascular status: blood pressure returned to baseline and stable Postop Assessment: no apparent nausea or vomiting Anesthetic complications: no   There were no known notable events for this encounter.  Last Vitals:  Vitals:   05/29/24 1507 05/29/24 1600  BP: (!) 193/79 115/72  Pulse: 60 80  Resp: 16 16  Temp: 36.8 C 36.7 C  SpO2: 100% 100%    Last Pain:  Vitals:   05/29/24 1600  TempSrc: Oral  PainSc:                  Lynwood MARLA Cornea

## 2024-05-29 NOTE — Transfer of Care (Signed)
 Immediate Anesthesia Transfer of Care Note  Patient: Shelley [name]  Procedure(s) Performed: EGD (ESOPHAGOGASTRODUODENOSCOPY)  Patient Location: PACU  Anesthesia Type:MAC  Level of Consciousness: awake, alert , and oriented  Airway & Oxygen Therapy: Patient Spontanous Breathing and Patient connected to nasal cannula oxygen  Post-op Assessment: Report given to RN and Post -op Vital signs reviewed and stable  Post vital signs: Reviewed and stable  Last Vitals:  Vitals Value Taken Time  BP 150/70 1430  Temp 97 1430  Pulse 70 1430  Resp 16 1430  SpO2 98 1430    Last Pain:  Vitals:   05/29/24 1308  TempSrc: Temporal  PainSc: 5       Patients Stated Pain Goal: 0 (05/27/24 2000)  Complications: No notable events documented.

## 2024-05-29 NOTE — Progress Notes (Signed)
 PROGRESS NOTE    Shelley [name]  FMW:[id number] DOB: [birth year] DOA: 05/20/2024 PCP: Ilah Crigler, MD   Brief Narrative:  This 62 years old female with history of deafness, sleep apnea, alcohol abuse who presented to the ED with complaints of nausea, vomiting, abdominal discomfort for 2 weeks. Had multiple ER visits for the same recently. On presentation, CT abdomen/pelvis showed acute sigmoid diverticulitis with increasing inflammatory stranding, started on antibiotics for diverticulitis. Cultures have not been sent. Hospital course remarkable for intractable nausea and vomiting.  Repeat CT abdomen shows no changes.  Her EKG also showed QT prolongation, bradycardia, ST changes so cardiology consulted. Due to her EKG changes, cardiology recommended to transfer to [facility].   Assessment & Plan:   Principal Problem:   Intractable nausea and vomiting Active Problems:   Abnormal ECG   Deafness   Alcohol abuse   Intractable vomiting   Diverticulitis of sigmoid colon   Acute sigmoid diverticulitis:  - Patient does not meet sepsis criteria -presented with nausea, vomiting, abdominal discomfort.   - CT confirmed acute sigmoid diverticulitis without perforation.  - Continue Antibiotics with ceftriaxone  and Flagyl  -Transiently worsening symptoms over the past 48 hours, repeat CT abdomen pelvis on 05/24/2024 without any notable changes, perforation or abscess formation. - Reglan  previously discontinued for prolonged Qtc.   Bradycardia/QT prolongation:  - Cardiology consulted given questionable ST changes and T wave inversion with concurrent long QT - Echo without notable abnormalities, EF 55% without notable diastolic or systolic dysfunction - Troponin flat, not consistent with ACS - Avoid QT prolonging medications. - She is getting a scopolamine  patch, Compazine  and Zyprexa  for severe vomiting. -Consider IM Tigan. - QT Prolongation has significantly improved. -Cardiology recommended referral for  genetic testing for long QT syndrome   Intractable nausea : - Vomiting appears to be improving. - Continue scopolamine  patch, limited medication availability due to QT prolongation. - If patient continue to have nausea and vomiting,  GI is planning for EGD tentatively scheduled for tomorrow.   History of hypothyroidism: - Per chart review, no recent labs available, last in 2023 were within normal limits. - Repeat labs here remain within normal limits - Patient not currently on any supplementation, unclear if patient carries this diagnosis or if her prior episode of hypothyroidism was transient/provoked.   Hypokalemia/hypomagnesemia: Replaced.  Continue to monitor   Elevated lipase: Likely secondary to above - CT abdomen/pelvis x2 without overt inflammation or fluid collection near the pancreas.   Chronic alcohol abuse:  -Questionable diagnosis -last drink was weeks ago, no signs or symptoms of withdrawal -No indication for CIWA protocol at this time given length of time without alcohol -Continue vitamin supplementation with thiamine  and folic acid    Hypertension:  Reportedly not on antihypertensive at home. Trying to avoid nodal blocking agents due to bradycardia Losartan  added given borderline hypertension, avoid rate control medications due to ongoing bradycardia     DVT prophylaxis:Lovenox  Code Status: Full code Family Communication:No family at bed side Disposition Plan:    Status is: Inpatient Remains inpatient appropriate because: Severity of illness.  Patient continues to have intractable nausea and vomiting.  She is scheduled for EGD tomorrow.    Consultants:  Cardiology Gastroenterology  Procedures:  Antimicrobials: Anti-infectives (From admission, onward)    Start     Dose/Rate Route Frequency Ordered Stop   05/21/24 1000  metroNIDAZOLE  (FLAGYL ) IVPB 500 mg  Status:  Discontinued        500 mg 100 mL/hr over 60 Minutes Intravenous Every 12 hours  05/21/24  0832 05/27/24 1352   05/21/24 1000  cefTRIAXone  (ROCEPHIN ) 2 g in sodium chloride  0.9 % 100 mL IVPB  Status:  Discontinued        2 g 200 mL/hr over 30 Minutes Intravenous Every 24 hours 05/21/24 0842 05/27/24 1352   05/21/24 0930  ciprofloxacin  (CIPRO ) IVPB 400 mg  Status:  Discontinued        400 mg 200 mL/hr over 60 Minutes Intravenous Every 12 hours 05/21/24 9167 05/21/24 0841       Subjective: Patient was seen and examined at bedside. Overnight events noted. Language interpreter used for sign language.  All the patient's questions were answered. Patient still reports having nausea and vomiting. QT interval has improved. She is scheduled for EGD tomorrow.  Objective: Vitals:   05/28/24 2312 05/29/24 0450 05/29/24 0812 05/29/24 1151  BP: (!) 158/95 (!) 149/89 (!) 135/101 (!) 146/81  Pulse: (!) 48 (!) 48 (!) 52 (!) 54  Resp: 19 18 17 18   Temp: 97.6 F (36.4 C) (!) 97.5 F (36.4 C) 97.7 F (36.5 C) 97.6 F (36.4 C)  TempSrc: Oral Oral Oral Oral  SpO2: 97% 97% 97% 100%  Weight:      Height:        Intake/Output Summary (Last 24 hours) at 05/29/2024 1231 Last data filed at 05/28/2024 1816 Gross per 24 hour  Intake 710.93 ml  Output --  Net 710.93 ml   Filed Weights   05/20/24 2349 05/24/24 1250  Weight: 83 kg 82.7 kg    Examination:  General exam: Appears calm and comfortable, not in any acute distress. Respiratory system: CTA Bilaterally. Respiratory effort normal.  RR 15 Cardiovascular system: S1 & S2 heard, RRR. No JVD, murmurs, rubs, gallops or clicks.  Gastrointestinal system: Abdomen is nondistended, soft and nontender.  Normal bowel sounds heard. Central nervous system: Alert and oriented x 3. No focal neurological deficits. Extremities: No edema, no cyanosis, no clubbing Skin: No rashes, lesions or ulcers Psychiatry: Judgement and insight appear normal. Mood & affect appropriate.    Data Reviewed: I have personally reviewed following labs and imaging  studies  CBC: Recent Labs  Lab 05/23/24 0441 05/24/24 0546 05/25/24 0506 05/26/24 0539 05/27/24 0558  WBC 5.5 4.9 5.3 4.4 5.0  HGB 16.2* 15.2* 16.5* 16.5* 16.0*  HCT 47.7* 47.0* 48.2* 47.0* 46.2*  MCV 91.0 92.3 88.6 88.3 88.3  PLT 269 253 278 247 236   Basic Metabolic Panel: Recent Labs  Lab 05/23/24 0441 05/23/24 1722 05/24/24 0546 05/25/24 0506 05/26/24 0539 05/27/24 0558 05/28/24 0401 05/29/24 0452  NA 134*   < > 136 132* 131* 131* 130* 132*  K 3.1*   < > 3.7 3.2* 3.7 3.4* 3.5 4.0  CL 99   < > 102 98 102 97* 100 99  CO2 23   < > 22 21* 18* 21* 21* 23  GLUCOSE 84   < > 80 78 74 73 80 78  BUN <5*   < > <5* <5* <5* <5* <5* <5*  CREATININE 0.63   < > 0.72 0.85 0.73 0.79 0.71 0.67  CALCIUM 8.8*   < > 8.7* 8.8* 8.8* 9.1 8.9 9.0  MG 2.0  --  1.8  --   --  1.5* 1.8 1.8  PHOS 3.1  --  2.9  --   --  2.9  --   --    < > = values in this interval not displayed.   GFR: Estimated Creatinine Clearance: 79.1 mL/min (  by C-G formula based on SCr of 0.67 mg/dL). Liver Function Tests: Recent Labs  Lab 05/24/24 0546  AST 19  ALT 16  ALKPHOS 54  BILITOT 0.9  PROT 5.6*  ALBUMIN 2.9*   Recent Labs  Lab 05/23/24 1721 05/24/24 0546 05/25/24 0506 05/26/24 0539  LIPASE 91* 124* 126* 115*   No results for input(s): AMMONIA in the last 168 hours. Coagulation Profile: No results for input(s): INR, PROTIME in the last 168 hours.  Cardiac Enzymes: No results for input(s): CKTOTAL, CKMB, CKMBINDEX, TROPONINI in the last 168 hours. BNP (last 3 results) No results for input(s): PROBNP in the last 8760 hours. HbA1C: No results for input(s): HGBA1C in the last 72 hours. CBG: Recent Labs  Lab 05/23/24 0002 05/23/24 0731 05/23/24 1616 05/24/24 0029 05/24/24 0735  GLUCAP 110* 96 94 88 88   Lipid Profile: No results for input(s): CHOL, HDL, LDLCALC, TRIG, CHOLHDL, LDLDIRECT in the last 72 hours. Thyroid  Function Tests: No results for input(s):  TSH, T4TOTAL, FREET4, T3FREE, THYROIDAB in the last 72 hours.  Anemia Panel: No results for input(s): VITAMINB12, FOLATE, FERRITIN, TIBC, IRON, RETICCTPCT in the last 72 hours. Sepsis Labs: No results for input(s): PROCALCITON, LATICACIDVEN in the last 168 hours.  No results found for this or any previous visit (from the past 240 hours).   Radiology Studies: No results found.  Scheduled Meds:  enoxaparin  (LOVENOX ) injection  40 mg Subcutaneous Q24H   losartan   25 mg Oral Daily   OLANZapine  zydis  5 mg Oral Daily   pantoprazole  (PROTONIX ) IV  40 mg Intravenous Q12H   scopolamine   1 patch Transdermal Q72H   Continuous Infusions:  sodium chloride  20 mL/hr at 05/28/24 2254   magnesium  sulfate bolus IVPB 4 g (05/29/24 1222)     LOS: 8 days    Time spent: 35 mins    Darcel Dawley, MD [facility]   If 7PM-7AM, please contact night-coverage

## 2024-05-29 NOTE — Op Note (Signed)
 [facility] Patient Name: Shelley [name] Procedure Date : 05/29/2024 MRN: [id number] Attending MD: Belvie Just , MD, [id number] Date of Birth: October 23, 1960 CSN: [id number] Age: 63 Admit Type: Inpatient Procedure:                Upper GI endoscopy Indications:              Nausea with vomiting Providers:                Belvie Just, MD, Jacquelyn Jaci Pierce, RN, Corene Southgate, Technician Referring MD:              Medicines:                Propofol  per Anesthesia Complications:            No immediate complications. Estimated Blood Loss:     Estimated blood loss: none. Procedure:                Pre-Anesthesia Assessment:                           - Prior to the procedure, a History and Physical                            was performed, and patient medications and                            allergies were reviewed. The patient's tolerance of                            previous anesthesia was also reviewed. The risks                            and benefits of the procedure and the sedation                            options and risks were discussed with the patient.                            All questions were answered, and informed consent                            was obtained. Prior Anticoagulants: The patient has                            taken no anticoagulant or antiplatelet agents. ASA                            Grade Assessment: III - A patient with severe                            systemic disease. After reviewing the risks and  benefits, the patient was deemed in satisfactory                            condition to undergo the procedure.                           - Sedation was administered by an anesthesia                            professional. Deep sedation was attained.                           After obtaining informed consent, the endoscope was                            passed under direct vision.  Throughout the                            procedure, the patient's blood pressure, pulse, and                            oxygen saturations were monitored continuously. The                            GIF-H190 ([id number]) Olympus endoscope was introduced                            through the mouth, and advanced to the second part                            of duodenum. The upper GI endoscopy was                            accomplished without difficulty. The patient                            tolerated the procedure well. Scope In: Scope Out: Findings:      The esophagus was normal.      Diffuse moderately erythematous mucosa without bleeding was found in the       gastric body and in the gastric antrum. Biopsies were taken with a cold       forceps for Helicobacter pylori testing.      Two non-bleeding superficial duodenal ulcers with a clean ulcer base       (Forrest Class III) were found in the duodenal bulb. The largest lesion       was 3 mm in largest dimension.      Two healing superficial duodenal bulb ulcers were noted. Impression:               - Normal esophagus.                           - Erythematous mucosa in the gastric body and  antrum. Biopsied.                           - Non-bleeding duodenal ulcers with a clean ulcer                            base (Forrest Class III). Recommendation:           - Return patient to hospital ward for ongoing care.                           - Resume regular diet.                           - Continue present medications.                           - Await pathology results.                           - Trial of sucralfate  1 gram TID. Procedure Code(s):        --- Professional ---                           [phone], Esophagogastroduodenoscopy, flexible,                            transoral; with biopsy, single or multiple Diagnosis Code(s):        --- Professional ---                           K31.89, Other diseases  of stomach and duodenum                           K26.9, Duodenal ulcer, unspecified as acute or                            chronic, without hemorrhage or perforation                           R11.2, Nausea with vomiting, unspecified CPT copyright 2022 [employer]. All rights reserved. The codes documented in this report are preliminary and upon coder review may  be revised to meet current compliance requirements. Belvie Just, MD Belvie Just, MD 05/29/2024 2:27:43 PM This report has been signed electronically. Number of Addenda: 0

## 2024-05-30 DIAGNOSIS — R112 Nausea with vomiting, unspecified: Secondary | ICD-10-CM | POA: Diagnosis not present

## 2024-05-30 LAB — BASIC METABOLIC PANEL WITH GFR
Anion gap: 14 (ref 5–15)
BUN: <5 mg/dL — ABNORMAL LOW (ref 8–23)
CO2: 19 mmol/L — ABNORMAL LOW (ref 22–32)
Calcium: 9.2 mg/dL (ref 8.9–10.3)
Chloride: 98 mmol/L (ref 98–111)
Creatinine, Ser: 0.72 mg/dL (ref 0.44–1.00)
GFR, Estimated: >60 mL/min (ref >60)
Glucose, Bld: 79 mg/dL (ref 70–99)
Potassium: 4.1 mmol/L (ref 3.5–5.1)
Sodium: 131 mmol/L — ABNORMAL LOW (ref 135–145)

## 2024-05-30 LAB — MAGNESIUM: Magnesium: 1.9 mg/dL (ref 1.7–2.4)

## 2024-05-30 MED ORDER — MAGNESIUM SULFATE 2 GM/50ML IV SOLN
2.0000 g | Freq: Once | INTRAVENOUS | Status: AC
  Administered 2024-05-30: 2 g via INTRAVENOUS
  Filled 2024-05-30: qty 50

## 2024-05-30 NOTE — Plan of Care (Signed)

## 2024-05-30 NOTE — Progress Notes (Signed)
 Mobility Specialist Progress Note:    05/30/24 1421  Mobility  Activity Ambulated with assistance  Level of Assistance Standby assist, set-up cues, supervision of patient - no hands on  Assistive Device Front wheel walker  Distance Ambulated (ft) 150 ft  Activity Response Tolerated well  Mobility visit 1 Mobility  Mobility Specialist Start Time (ACUTE ONLY) 1421  Mobility Specialist Stop Time (ACUTE ONLY) 1428  Mobility Specialist Time Calculation (min) (ACUTE ONLY) 7 min   Pt asking to go for short walk w/ family member. No c/o any symptoms. Pt moving well using RW. Returned pt to room w/ all needs met.   Venetia Keel Mobility Specialist Please Neurosurgeon or Rehab Office at [phone]

## 2024-05-30 NOTE — Progress Notes (Signed)
 PROGRESS NOTE    Shelley [name]  FMW:[id number] DOB: December 03, 1960 DOA: 05/20/2024  PCP: Ilah Crigler, MD   Brief Narrative:  This 63 years old female with history of deafness, sleep apnea, alcohol abuse who presented to the ED with complaints of nausea, vomiting, abdominal discomfort for 2 weeks. Had multiple ER visits for the same recently. On presentation, CT abdomen/pelvis showed acute sigmoid diverticulitis with increasing inflammatory stranding, started on antibiotics for diverticulitis. Cultures have not been sent. Hospital course remarkable for intractable nausea and vomiting.  Repeat CT abdomen shows no changes.  Her EKG also showed QT prolongation, bradycardia, ST changes so cardiology consulted. Due to her EKG changes, cardiology recommended to transfer to [facility].   Assessment & Plan:   Principal Problem:   Intractable nausea and vomiting Active Problems:   Abnormal ECG   Deafness   Alcohol abuse   Intractable vomiting   Diverticulitis of sigmoid colon   Acute sigmoid diverticulitis:  - Patient does not meet sepsis criteria -presented with nausea, vomiting, abdominal discomfort.   - CT confirmed acute sigmoid diverticulitis without perforation.  - Completed Antibiotics with ceftriaxone  and Flagyl  for 7 days. -Transiently worsening symptoms over the past 48 hours, repeat CT abdomen pelvis on 05/24/2024 without any notable changes, perforation or abscess formation. - Reglan  previously discontinued for prolonged Qtc.   Bradycardia/QT prolongation:  - Cardiology consulted given questionable ST changes and T wave inversion with concurrent long QT - Echo without notable abnormalities, EF 55% without notable diastolic or systolic dysfunction. - Troponin flat, not consistent with ACS. - Avoid QT prolonging medications. - She is getting a scopolamine  patch, Compazine  and Zyprexa  for severe vomiting. -Consider IM Tigan. - QT Prolongation has significantly improved. -Cardiology  recommended referral for genetic testing for long QT syndrome. -Cardio signed off.   Intractable nausea : - Vomiting appears to be improving. - Continue scopolamine  patch, limited medication availability due to QT prolongation. - If patient continue to have nausea and vomiting, GI consulted, underwent endoscopy. - Enteroscopy shows normal esophagus, gastritis, 2 nonbleeding gastric ulcers with clean base.   History of hypothyroidism: - Per chart review, no recent labs available, last in 2023 were within normal limits. - Repeat labs here remain within normal limits - Patient not currently on any supplementation, unclear if patient carries this diagnosis or if her prior episode of hypothyroidism was transient/provoked.   Hypokalemia/hypomagnesemia: Replaced.  Continue to monitor   Elevated lipase: Likely secondary to above. -CT abdomen/pelvis x 2 without overt inflammation or fluid collection near the pancreas.   Chronic alcohol abuse:  -Questionable diagnosis -last drink was weeks ago, no signs or symptoms of withdrawal -No indication for CIWA protocol at this time given length of time without alcohol -Continue vitamin supplementation with thiamine  and folic acid .   Hypertension:  Reportedly not on antihypertensives at home. Trying to avoid nodal blocking agents due to bradycardia Losartan  added given borderline hypertension, avoid rate control medications due to ongoing bradycardia   DVT prophylaxis:Lovenox  Code Status: Full code Family Communication: No family at bed side Disposition Plan:    Status is: Inpatient Remains inpatient appropriate because: Severity of illness.  Patient continues to have intractable nausea and vomiting.  She underwent EGD, found to have 2 nonbleeding duodenal ulcers with clean base.    Consultants:  Cardiology Gastroenterology  Procedures:  Antimicrobials: Anti-infectives (From admission, onward)    Start     Dose/Rate Route Frequency  Ordered Stop   05/21/24 1000  metroNIDAZOLE  (FLAGYL ) IVPB 500 mg  Status:  Discontinued        500 mg 100 mL/hr over 60 Minutes Intravenous Every 12 hours 05/21/24 0832 05/27/24 1352   05/21/24 1000  cefTRIAXone  (ROCEPHIN ) 2 g in sodium chloride  0.9 % 100 mL IVPB  Status:  Discontinued        2 g 200 mL/hr over 30 Minutes Intravenous Every 24 hours 05/21/24 0842 05/27/24 1352   05/21/24 0930  ciprofloxacin  (CIPRO ) IVPB 400 mg  Status:  Discontinued        400 mg 200 mL/hr over 60 Minutes Intravenous Every 12 hours 05/21/24 9167 05/21/24 0841       Subjective: Patient was seen and examined at bedside. Overnight events noted. Language interpreter used for sign language.  All the patient's questions were answered. QT interval has improved.  Nausea and vomiting has improved. She underwent EGD found to have gastritis, duodenitis.  Objective: Vitals:   05/29/24 1933 05/29/24 2300 05/30/24 0438 05/30/24 0845  BP: (!) 146/70 118/70 (!) 143/79 (!) 145/78  Pulse: 68 68 65 79  Resp: 20 18  18   Temp: 98.2 F (36.8 C) 98.3 F (36.8 C) 97.9 F (36.6 C) 97.6 F (36.4 C)  TempSrc: Oral Oral Oral Oral  SpO2: 99% 96% 96% 96%  Weight:      Height:        Intake/Output Summary (Last 24 hours) at 05/30/2024 1132 Last data filed at 05/30/2024 0800 Gross per 24 hour  Intake 360 ml  Output 0 ml  Net 360 ml   Filed Weights   05/20/24 2349 05/24/24 1250  Weight: 83 kg 82.7 kg    Examination:  General exam: Appears calm and comfortable, not in any acute distress. Respiratory system: CTA Bilaterally. Respiratory effort normal.  RR 12 Cardiovascular system: S1 & S2 heard, RRR. No JVD, murmurs, rubs, gallops or clicks.  Gastrointestinal system: Abdomen is nondistended, soft and nontender.  Normal bowel sounds heard. Central nervous system: Alert and oriented x 3. No focal neurological deficits. Extremities: No edema, no cyanosis, no clubbing Skin: No rashes, lesions or ulcers Psychiatry:  Judgement and insight appear normal. Mood & affect appropriate.    Data Reviewed: I have personally reviewed following labs and imaging studies  CBC: Recent Labs  Lab 05/24/24 0546 05/25/24 0506 05/26/24 0539 05/27/24 0558  WBC 4.9 5.3 4.4 5.0  HGB 15.2* 16.5* 16.5* 16.0*  HCT 47.0* 48.2* 47.0* 46.2*  MCV 92.3 88.6 88.3 88.3  PLT 253 278 247 236   Basic Metabolic Panel: Recent Labs  Lab 05/24/24 0546 05/25/24 0506 05/26/24 0539 05/27/24 0558 05/28/24 0401 05/29/24 0452 05/30/24 0438  NA 136   < > 131* 131* 130* 132* 131*  K 3.7   < > 3.7 3.4* 3.5 4.0 4.1  CL 102   < > 102 97* 100 99 98  CO2 22   < > 18* 21* 21* 23 19*  GLUCOSE 80   < > 74 73 80 78 79  BUN <5*   < > <5* <5* <5* <5* <5*  CREATININE 0.72   < > 0.73 0.79 0.71 0.67 0.72  CALCIUM 8.7*   < > 8.8* 9.1 8.9 9.0 9.2  MG 1.8  --   --  1.5* 1.8 1.8 1.9  PHOS 2.9  --   --  2.9  --   --   --    < > = values in this interval not displayed.   GFR: Estimated Creatinine Clearance: 79.1 mL/min (by C-G formula based on  SCr of 0.72 mg/dL). Liver Function Tests: Recent Labs  Lab 05/24/24 0546  AST 19  ALT 16  ALKPHOS 54  BILITOT 0.9  PROT 5.6*  ALBUMIN 2.9*   Recent Labs  Lab 05/23/24 1721 05/24/24 0546 05/25/24 0506 05/26/24 0539  LIPASE 91* 124* 126* 115*   No results for input(s): AMMONIA in the last 168 hours. Coagulation Profile: No results for input(s): INR, PROTIME in the last 168 hours.  Cardiac Enzymes: No results for input(s): CKTOTAL, CKMB, CKMBINDEX, TROPONINI in the last 168 hours. BNP (last 3 results) No results for input(s): PROBNP in the last 8760 hours. HbA1C: No results for input(s): HGBA1C in the last 72 hours. CBG: Recent Labs  Lab 05/23/24 1616 05/24/24 0029 05/24/24 0735  GLUCAP 94 88 88   Lipid Profile: No results for input(s): CHOL, HDL, LDLCALC, TRIG, CHOLHDL, LDLDIRECT in the last 72 hours. Thyroid  Function Tests: No results for  input(s): TSH, T4TOTAL, FREET4, T3FREE, THYROIDAB in the last 72 hours.  Anemia Panel: No results for input(s): VITAMINB12, FOLATE, FERRITIN, TIBC, IRON, RETICCTPCT in the last 72 hours. Sepsis Labs: No results for input(s): PROCALCITON, LATICACIDVEN in the last 168 hours.  No results found for this or any previous visit (from the past 240 hours).   Radiology Studies: No results found.  Scheduled Meds:  enoxaparin  (LOVENOX ) injection  40 mg Subcutaneous Q24H   losartan   25 mg Oral Daily   OLANZapine  zydis  5 mg Oral Daily   pantoprazole  (PROTONIX ) IV  40 mg Intravenous Q12H   scopolamine   1 patch Transdermal Q72H   sucralfate   1 g Oral TID WC & HS   Continuous Infusions:     LOS: 9 days    Time spent: 35 mins    Darcel Dawley, MD [facility]   If 7PM-7AM, please contact night-coverage

## 2024-05-31 ENCOUNTER — Other Ambulatory Visit (HOSPITAL_COMMUNITY): Payer: Self-pay

## 2024-05-31 DIAGNOSIS — R112 Nausea with vomiting, unspecified: Secondary | ICD-10-CM | POA: Diagnosis not present

## 2024-05-31 LAB — BASIC METABOLIC PANEL WITH GFR
Anion gap: 12 (ref 5–15)
BUN: <5 mg/dL — ABNORMAL LOW (ref 8–23)
CO2: 22 mmol/L (ref 22–32)
Calcium: 8.9 mg/dL (ref 8.9–10.3)
Chloride: 96 mmol/L — ABNORMAL LOW (ref 98–111)
Creatinine, Ser: 0.67 mg/dL (ref 0.44–1.00)
GFR, Estimated: >60 mL/min (ref >60)
Glucose, Bld: 89 mg/dL (ref 70–99)
Potassium: 3.3 mmol/L — ABNORMAL LOW (ref 3.5–5.1)
Sodium: 130 mmol/L — ABNORMAL LOW (ref 135–145)

## 2024-05-31 LAB — MAGNESIUM: Magnesium: 1.8 mg/dL (ref 1.7–2.4)

## 2024-05-31 MED ORDER — OLANZAPINE 5 MG PO TABS
5.0000 mg | ORAL_TABLET | Freq: Every day | ORAL | 0 refills | Status: DC
  Filled 2024-05-31 (×2): qty 30, 30d supply, fill #0

## 2024-05-31 MED ORDER — POTASSIUM CHLORIDE CRYS ER 20 MEQ PO TBCR
40.0000 meq | EXTENDED_RELEASE_TABLET | Freq: Once | ORAL | Status: AC
  Administered 2024-05-31: 40 meq via ORAL

## 2024-05-31 MED ORDER — LOSARTAN POTASSIUM 25 MG PO TABS
25.0000 mg | ORAL_TABLET | Freq: Every day | ORAL | 0 refills | Status: DC
  Filled 2024-05-31 (×2): qty 30, 30d supply, fill #0

## 2024-05-31 MED ORDER — POTASSIUM CHLORIDE 20 MEQ PO PACK: 40.0000 meq | PACK | Freq: Once | ORAL | Status: DC

## 2024-05-31 MED ORDER — SCOPOLAMINE 1 MG/3DAYS TD PT72
1.0000 | MEDICATED_PATCH | TRANSDERMAL | 0 refills | Status: DC
  Filled 2024-05-31: qty 9, 27d supply, fill #0
  Filled 2024-05-31: qty 10, 30d supply, fill #0

## 2024-05-31 MED ORDER — MAGNESIUM SULFATE 2 GM/50ML IV SOLN
2.0000 g | Freq: Once | INTRAVENOUS | Status: AC
  Administered 2024-05-31: 2 g via INTRAVENOUS
  Filled 2024-05-31: qty 50

## 2024-05-31 MED ORDER — TRIMETHOBENZAMIDE HCL 300 MG PO CAPS
300.0000 mg | ORAL_CAPSULE | ORAL | 0 refills | Status: AC | PRN
  Filled 2024-05-31: qty 6, 2d supply, fill #0

## 2024-05-31 NOTE — Progress Notes (Signed)
 FYI patient wrote that her abdomen felt numb. Pt had 2 bowel movement's on 05/29/24. Listened to four quadrants and there was sound but hypoactive. Pt has no pain in abdomen just the numb feeling. I let Dr. Georgina know as I told patient I would pass it on. Dr. Georgina said nothing to do at this time. Will continue to monitor and pass it on to day shift.

## 2024-05-31 NOTE — Progress Notes (Signed)
 [place]ie[place] AVS with help of ASL interpretoer  and delivered meds bedside

## 2024-05-31 NOTE — Progress Notes (Signed)
 DISCHARGE NOTE HOME Shelley [name] to be discharged Home per MD order. Discussed prescriptions and follow up appointments with the patient. Prescriptions given to patient; medication list explained in detail. Patient verbalized understanding.  Skin clean, dry and intact without evidence of skin break down, no evidence of skin tears noted. IV catheter discontinued intact. Site without signs and symptoms of complications. Dressing and pressure applied. Pt denies pain at the site currently. No complaints noted.  Patient free of lines, drains, and wounds.   An After Visit Summary (AVS) was printed and given to the patient.  Shelley SHAUNNA Pepper, RN

## 2024-05-31 NOTE — Discharge Summary (Signed)
 Physician Discharge Summary  Shelley [name] FMW:[id number] DOB: 1961/05/02 DOA: 05/20/2024  PCP: Shelley Crigler, MD  Admit date: 05/20/2024  Discharge date: 05/31/2024  Admitted From: Home  Disposition:  Home  Recommendations for Outpatient Follow-up:  Follow up with PCP in 1-2 weeks. Please obtain BMP/CBC in one week. Advised to take scopolamine  patch every 3 days as needed for nausea and vomiting. Advised to continue pantoprazole  40 mg daily. Advised to take Tigan  as needed for worsening nausea and vomiting.   Follow-up with GI as scheduled  Home Health: None Equipment/Devices:None  Discharge Condition: Stable CODE STATUS:Full code Diet recommendation: Heart Healthy  Brief [facility] Course: This 63 years old female with history of deafness, sleep apnea, alcohol abuse who presented to the ED with complaints of nausea, vomiting, abdominal discomfort for 2 weeks. Had multiple ER visits for the same recently. On presentation, CT abdomen/pelvis showed acute sigmoid diverticulitis with increasing inflammatory stranding, started on antibiotics for diverticulitis. Cultures have not been sent. Hospital course remarkable for intractable nausea and vomiting. Repeat CT abdomen shows no changes. Her EKG also showed QT prolongation, bradycardia, ST changes so cardiology consulted. Due to her EKG changes, cardiology recommended to transfer to [facility]  Patient was continued on scopolamine  patch for nausea and vomiting given long QT interval.  Cardiology and gastroenterology was consulted.  Patient subsequently underwent EGD, she is found to have nonbleeding duodenal ulcers with clear base.  Patient started on pantoprazole .  Nausea and vomiting has improved.  Patient feels much better and wants to be discharged home.  GI signed off.  Patient is being given prescription for scopolamine  patch and Tigan  as needed for nausea and vomiting.   Discharge Diagnoses:  Principal Problem:   Intractable nausea  and vomiting Active Problems:   Abnormal ECG   Deafness   Alcohol abuse   Intractable vomiting   Diverticulitis of sigmoid colon  Acute sigmoid diverticulitis:  - Patient does not meet sepsis criteria -presented with nausea, vomiting, abdominal discomfort.   - CT confirmed acute sigmoid diverticulitis without perforation.  - Completed Antibiotics with ceftriaxone  and Flagyl  for 7 days. -Transiently worsening symptoms over the past 48 hours, repeat CT abdomen pelvis on 05/24/2024 without any notable changes, perforation or abscess formation. - Reglan  previously discontinued for prolonged Qtc.   Bradycardia/QT prolongation:  - Cardiology consulted given questionable ST changes and T wave inversion with concurrent long QT - Echo without notable abnormalities, EF 55% without notable diastolic or systolic dysfunction. - Troponin flat, not consistent with ACS. - Avoid QT prolonging medications. - She is getting a scopolamine  patch, Compazine  and Zyprexa  for severe vomiting. -Consider IM Tigan . - QT Prolongation has significantly improved. -Cardiology recommended referral for genetic testing for long QT syndrome. -Cardio signed off.   Intractable nausea : - Vomiting appears to be improving. - Continue scopolamine  patch, limited medication availability due to QT prolongation. - If patient continue to have nausea and vomiting, GI consulted, underwent endoscopy. - Enteroscopy shows normal esophagus, gastritis, 2 nonbleeding gastric ulcers with clean base. -Nausea and vomiting has improved.   History of hypothyroidism: - Per chart review, no recent labs available, last in 2023 were within normal limits. - Repeat labs here remain within normal limits - Patient not currently on any supplementation, unclear if patient carries this diagnosis or if her prior episode of hypothyroidism was transient/provoked.   Hypokalemia/hypomagnesemia: Replaced.  Continue to monitor   Elevated lipase: Likely  secondary to above. -CT abdomen/pelvis x 2 without overt inflammation or fluid collection  near the pancreas.   Chronic alcohol abuse:  -Questionable diagnosis -last drink was weeks ago, no signs or symptoms of withdrawal -No indication for CIWA protocol at this time given length of time without alcohol -Continue vitamin supplementation with thiamine  and folic acid .   Hypertension:  Reportedly not on antihypertensives at home. Trying to avoid [name] blocking agents due to bradycardia Losartan  added given borderline hypertension, avoid rate control medications due to ongoing bradycardia  Discharge Instructions  Discharge Instructions     Ambulatory referral to Genetics   Complete by: As directed    Specific reason for medical genetics evaluation: genetic testing for LQT syndrome   Call MD for:  difficulty breathing, headache or visual disturbances   Complete by: As directed    Call MD for:  persistant dizziness or light-headedness   Complete by: As directed    Call MD for:  persistant nausea and vomiting   Complete by: As directed    Diet - low sodium heart healthy   Complete by: As directed    Diet general   Complete by: As directed    Discharge instructions   Complete by: As directed    Advised to follow-up with primary care physician in 1 week. Advised to take scopolamine  patch every 3 days as needed for nausea and vomiting Advised to continue pantoprazole  40 mg daily Advised to take take on as needed for worsening nausea and vomiting.   Follow-up with GI as scheduled   Increase activity slowly   Complete by: As directed       Allergies as of 05/31/2024       Reactions   Penicillins Other (See Comments)   Caused deafness per pt   Tylenol [acetaminophen] Other (See Comments)   Causes shaking        Medication List     STOP taking these medications    metoCLOPramide  10 MG tablet Commonly known as: REGLAN    potassium chloride  SA 20 MEQ tablet Commonly known  as: KLOR-CON  M   sucralfate  1 g tablet Commonly known as: Carafate        TAKE these medications    losartan  25 MG tablet Commonly known as: COZAAR  Take 1 tablet (25 mg total) by mouth daily. Start taking on: June 01, 2024   OLANZapine  5 MG tablet Commonly known as: ZYPREXA  Take 1 tablet (5 mg total) by mouth daily. Start taking on: June 01, 2024   pantoprazole  20 MG tablet Commonly known as: PROTONIX  Take 1 tablet (20 mg total) by mouth daily.   scopolamine  1 MG/3DAYS Commonly known as: TRANSDERM-SCOP Place 1 patch (1 mg total) onto the skin every 3 (three) days. Start taking on: June 02, 2024   trimethobenzamide  300 MG capsule Commonly known as: TIGAN  Take 1 capsule (300 mg total) by mouth as needed for up to 2 days for nausea/vomiting.        Follow-up Information     [facility] Emergency Department at [facility]. Go to .   Specialty: Emergency Medicine Why: If symptoms worsen Contact information: [address] [place]  [address] [phone]        Shelley Crigler, MD Follow up in 1 week(s).   Specialty: Family Medicine Contact information: [address] [place] [address] [phone]         Rollin Dover, MD Follow up in 1 week(s).   Specialty: Gastroenterology Contact information: [address] [place] [address] [phone]  Allergies  Allergen Reactions   Penicillins Other (See Comments)    Caused deafness per pt   Tylenol [Acetaminophen] Other (See Comments)    Causes shaking    Consultations: Gatsroeneterology   Procedures/Studies: CT ABDOMEN PELVIS W CONTRAST Result Date: 05/24/2024 CLINICAL DATA:  Left lower quadrant pain. Vomiting. Diverticulitis. EXAM: CT ABDOMEN AND PELVIS WITH CONTRAST TECHNIQUE: Multidetector CT imaging of the abdomen and pelvis was performed using the standard protocol following bolus administration of intravenous  contrast. RADIATION DOSE REDUCTION: This exam was performed according to the departmental dose-optimization program which includes automated exposure control, adjustment of the mA and/or kV according to patient size and/or use of iterative reconstruction technique. CONTRAST:  OMNIPAQUE  IOHEXOL  300 MG/ML  SOLN COMPARISON:  05/20/2024 FINDINGS: Lower Chest: No acute findings. Hepatobiliary: No suspicious hepatic masses identified. Gallstones are seen, but without signs of cholecystitis or biliary dilatation. Pancreas:  No mass or inflammatory changes. Spleen: Within normal limits in size and appearance. Adrenals/Urinary Tract: No suspicious masses identified. No evidence of ureteral calculi or hydronephrosis. Stomach/Bowel: Mild sigmoid diverticulitis shows no significant change compared to previous study. No No evidence of perforation or abscess. Vascular/Lymphatic: No pathologically enlarged lymph nodes. No acute vascular findings. Reproductive:  No mass or other significant abnormality. Other:  None. Musculoskeletal:  No suspicious bone lesions identified. IMPRESSION: No significant change in mild sigmoid diverticulitis. No evidence of perforation or abscess. Cholelithiasis. No radiographic evidence of cholecystitis. Electronically Signed   By: Norleen DELENA Kil M.D.   On: 05/24/2024 11:24   ECHOCARDIOGRAM COMPLETE Result Date: 05/24/2024    ECHOCARDIOGRAM REPORT   Patient Name:   [facility] Date of Exam: 05/24/2024 Medical Rec #:  [id number]     Height:       66.0 in Accession #:    [id number]    Weight:       183.0 lb Date of Birth:  [birth year]      BSA:          1.926 m Patient Age:    63 years      BP:           158/82 mmHg Patient Gender: F             HR:           48 bpm. Exam Location:  Inpatient Procedure: 2D Echo, Cardiac Doppler and Color Doppler (Both Spectral and Color            Flow Doppler were utilized during procedure). Indications:    Abnormal ECG R94.31  History:        Patient has no prior  history of Echocardiogram examinations.                 Risk Factors:Sleep Apnea.  Sonographer:    Jayson Gaskins Referring Phys: [id number] ELVAN SOR IMPRESSIONS  1. Left ventricular ejection fraction, by estimation, is 55%. The left ventricle has low normal function. The left ventricle has no regional wall motion abnormalities. There is mild left ventricular hypertrophy of the basal-septal segment. Left ventricular diastolic parameters were normal.  2. Right ventricular systolic function is normal. The right ventricular size is normal.  3. The mitral valve is grossly normal. Trivial mitral valve regurgitation. No evidence of mitral stenosis.  4. The aortic valve is tricuspid. Aortic valve regurgitation is not visualized. No aortic stenosis is present.  5. The inferior vena cava is normal in size with greater than 50% respiratory variability, suggesting right atrial pressure of 3  mmHg. Conclusion(s)/Recommendation(s): Normal biventricular function without evidence of hemodynamically significant valvular heart disease. FINDINGS  Left Ventricle: Left ventricular ejection fraction, by estimation, is 55%. The left ventricle has low normal function. The left ventricle has no regional wall motion abnormalities. Strain was performed and the global longitudinal strain is indeterminate. The left ventricular internal cavity size was normal in size. There is mild left ventricular hypertrophy of the basal-septal segment. Left ventricular diastolic parameters were normal. Right Ventricle: The right ventricular size is normal. No increase in right ventricular wall thickness. Right ventricular systolic function is normal. Left Atrium: Left atrial size was normal in size. Right Atrium: Right atrial size was normal in size. Pericardium: There is no evidence of pericardial effusion. Mitral Valve: The mitral valve is grossly normal. Normal mobility of the mitral valve leaflets. Trivial mitral valve regurgitation. No evidence of mitral  valve stenosis. Tricuspid Valve: The tricuspid valve is grossly normal. Tricuspid valve regurgitation is trivial. No evidence of tricuspid stenosis. Aortic Valve: The aortic valve is tricuspid. Aortic valve regurgitation is not visualized. No aortic stenosis is present. Aortic valve mean gradient measures 4.0 mmHg. Aortic valve peak gradient measures 7.0 mmHg. Aortic valve area, by VTI measures 1.69 cm. Pulmonic Valve: The pulmonic valve was not well visualized. Pulmonic valve regurgitation is not visualized. No evidence of pulmonic stenosis. Aorta: The aortic root is normal in size and structure. Venous: The inferior vena cava is normal in size with greater than 50% respiratory variability, suggesting right atrial pressure of 3 mmHg. IAS/Shunts: No atrial level shunt detected by color flow Doppler. Additional Comments: 3D was performed not requiring image post processing on an independent workstation and was indeterminate.  LEFT VENTRICLE PLAX 2D LVIDd:         4.50 cm   Diastology LVIDs:         2.80 cm   LV e' medial:    4.24 cm/s LV PW:         1.00 cm   LV E/e' medial:  11.9 LV IVS:        1.20 cm   LV e' lateral:   6.31 cm/s LVOT diam:     2.00 cm   LV E/e' lateral: 8.0 LV SV:         46 LV SV Index:   24 LVOT Area:     3.14 cm  RIGHT VENTRICLE RV S prime:     12.10 cm/s LEFT ATRIUM             Index        RIGHT ATRIUM           Index LA Vol (A2C):   31.2 ml 16.20 ml/m  RA Area:     14.70 cm LA Vol (A4C):   13.7 ml 7.11 ml/m   RA Volume:   32.80 ml  17.03 ml/m LA Biplane Vol: 21.3 ml 11.06 ml/m  AORTIC VALVE AV Area (Vmax):    2.03 cm AV Area (Vmean):   1.83 cm AV Area (VTI):     1.69 cm AV Vmax:           132.00 cm/s AV Vmean:          98.900 cm/s AV VTI:            0.274 m AV Peak Grad:      7.0 mmHg AV Mean Grad:      4.0 mmHg LVOT Vmax:         85.50 cm/s LVOT Vmean:  57.700 cm/s LVOT VTI:          0.147 m LVOT/AV VTI ratio: 0.54  AORTA Ao Root diam: 2.90 cm MITRAL VALVE MV Area (PHT):  2.72 cm    SHUNTS MV E velocity: 50.60 cm/s  Systemic VTI:  0.15 m MV A velocity: 64.30 cm/s  Systemic Diam: 2.00 cm MV E/A ratio:  0.79 Maude Emmer MD Electronically signed by Maude Emmer MD Signature Date/Time: 05/24/2024/10:57:28 AM    Final    CT ABDOMEN PELVIS W CONTRAST Result Date: 05/21/2024 CLINICAL DATA:  Intractable vomiting for the past few weeks, seen for the same issue here 05/06/2024. EXAM: CT ABDOMEN AND PELVIS WITH CONTRAST TECHNIQUE: Multidetector CT imaging of the abdomen and pelvis was performed using the standard protocol following bolus administration of intravenous contrast. RADIATION DOSE REDUCTION: This exam was performed according to the departmental dose-optimization program which includes automated exposure control, adjustment of the mA and/or kV according to patient size and/or use of iterative reconstruction technique. CONTRAST:  OMNIPAQUE  IOHEXOL  300 MG/ML  SOLN COMPARISON:  CTs with IV contrast 05/06/2024 and 01/06/2022. FINDINGS: Lower chest: No acute abnormality.  Mild cardiomegaly. Hepatobiliary: Respiratory motion limits fine detail. No liver masses seen through the breathing motion. The liver is slightly steatotic. There is focal periligamentous fat in segment 4 B. There are rounded noncalcified structures in the gallbladder consistent with either noncalcified stones or tumefactive sludge. The largest of these is 2.8 cm. This was seen previously. No biliary dilatation or wall thickening noted. Pancreas: No abnormality. Spleen: No abnormality. Adrenals/Urinary Tract: There is no adrenal mass. There is a 1.4 cm Bosniak 1 cyst in the upper pole left kidney, Hounsfield density is 0.1. No follow-up imaging recommended. There is no renal mass enhancement, calculus or obstruction. There is symmetric renal excretion on the delayed images. The bladder is unremarkable for the degree of distension. Stomach/Bowel: Unremarkable contracted stomach. Normal caliber small bowel without  inflammatory changes. Normal appendix. There is diffuse colonic diverticulosis. The proximal 8 cm of the sigmoid colon again demonstrates disproportionate wall thickening and increasing inflammatory stranding compared with August 5, findings consistent with acute sigmoid diverticulitis. A follow-up colonoscopy is recommended after treatment to exclude underlying lesion given the degree of wall thickening. Rest of the colon wall has a normal thickness. No further inflammation. Vascular/Lymphatic: Aortic atherosclerosis. No enlarged abdominal or pelvic lymph nodes. Reproductive: There is a stable, simple appearing thin walled left ovarian cyst which is unchanged back to 01/06/2022. The right ovary is unremarkable. The uterus is unremarkable. No adnexal mass is seen. Other: Small umbilical fat hernia. No incarcerated hernia. No free fluid, free hemorrhage, or free air including associated with diverticulitis. Musculoskeletal: Mild osteopenia and degenerative change lumbar spine. 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 No acute or other significant osseous findings are seen. IMPRESSION: 1. Acute sigmoid diverticulitis with increasing inflammatory stranding compared with 05/06/2024. A follow-up colonoscopy is recommended after treatment to exclude underlying lesion given the degree of wall thickening. 2. No bowel obstruction or perforation. 3. Aortic atherosclerosis. 4. Mild cardiomegaly. 5. Mild hepatic steatosis. 6. Noncalcified stones versus tumefactive sludge in the gallbladder. No biliary dilatation. 7. Stable simple appearing left ovarian cyst. No follow-up imaging recommended. 8. Umbilical fat hernia. 9. Osteopenia and degenerative change. Ankylosis over the inferior SI joints. Aortic Atherosclerosis (ICD10-I70.0). Electronically Signed   By: Francis Quam M.D.   On: 05/21/2024 00:01   CT ABDOMEN PELVIS W CONTRAST Result Date: 05/06/2024 CLINICAL DATA:  Acute abdominal pain  and nausea. EXAM: CT ABDOMEN  AND PELVIS WITH CONTRAST TECHNIQUE: Multidetector CT imaging of the abdomen and pelvis was performed using the standard protocol following bolus administration of intravenous contrast. RADIATION DOSE REDUCTION: This exam was performed according to the departmental dose-optimization program which includes automated exposure control, adjustment of the mA and/or kV according to patient size and/or use of iterative reconstruction technique. CONTRAST:  OMNIPAQUE  IOHEXOL  300 MG/ML  SOLN COMPARISON:  01/06/2022 FINDINGS: Lower Chest: No acute findings. Hepatobiliary: Geographic attic steatosis is nearly completely resolved since previous study. No suspicious hepatic masses identified. Gallbladder is unremarkable. No evidence of biliary ductal dilatation. Pancreas:  No mass or inflammatory changes. Spleen: Within normal limits in size and appearance. Adrenals/Urinary Tract: No suspicious masses identified. No evidence of ureteral calculi or hydronephrosis. Stomach/Bowel: No evidence of obstruction, inflammatory process or abnormal fluid collections. Diffuse colonic diverticulosis is again seen, without signs of diverticulitis. Vascular/Lymphatic: No pathologically enlarged lymph nodes. No acute vascular findings. Reproductive: 2.1 cm benign-appearing left ovarian cyst, without significant change, and consistent with benign etiology. No evidence of inflammatory process or free fluid. Other:  None. Musculoskeletal:  No suspicious bone lesions identified. IMPRESSION: No acute findings within the abdomen or pelvis. Decreased hepatic steatosis. Colonic diverticulosis, without radiographic evidence of diverticulitis. Stable small benign-appearing left ovarian cyst. Electronically Signed   By: Norleen DELENA Kil M.D.   On: 05/06/2024 13:14   EGD   Subjective: Patient seen and examined at bedside.  Overnight events noted. Patient reports feeling much improved,  nausea and vomiting improved. She wants to be discharged  home.  Discharge Exam: Vitals:   05/31/24 0555 05/31/24 0925  BP: (!) 146/82 (!) 155/77  Pulse: (!) 53 (!) 58  Resp: 15 18  Temp: 98.2 F (36.8 C) 97.6 F (36.4 C)  SpO2: 98% 98%   Vitals:   05/30/24 2058 05/30/24 2353 05/31/24 0555 05/31/24 0925  BP: (!) 152/83 (!) 157/85 (!) 146/82 (!) 155/77  Pulse: 68 61 (!) 53 (!) 58  Resp: 20 18 15 18   Temp: 98 F (36.7 C) 98.3 F (36.8 C) 98.2 F (36.8 C) 97.6 F (36.4 C)  TempSrc: Oral Oral Oral Oral  SpO2: 98% 99% 98% 98%  Weight:      Height:        General: Pt is alert, awake, not in acute distress Cardiovascular: RRR, S1/S2 +, no rubs, no gallops Respiratory: CTA bilaterally, no wheezing, no rhonchi Abdominal: Soft, NT, ND, bowel sounds + Extremities: no edema, no cyanosis    The results of significant diagnostics from this hospitalization (including imaging, microbiology, ancillary and laboratory) are listed below for reference.     Microbiology: No results found for this or any previous visit (from the past 240 hours).   Labs: BNP (last 3 results) Recent Labs    05/23/24 1722  BNP 75.2   Basic Metabolic Panel: Recent Labs  Lab 05/27/24 0558 05/28/24 0401 05/29/24 0452 05/30/24 0438 05/31/24 0437  NA 131* 130* 132* 131* 130*  K 3.4* 3.5 4.0 4.1 3.3*  CL 97* 100 99 98 96*  CO2 21* 21* 23 19* 22  GLUCOSE 73 80 78 79 89  BUN <5* <5* <5* <5* <5*  CREATININE 0.79 0.71 0.67 0.72 0.67  CALCIUM 9.1 8.9 9.0 9.2 8.9  MG 1.5* 1.8 1.8 1.9 1.8  PHOS 2.9  --   --   --   --    Liver Function Tests: No results for input(s): AST, ALT, ALKPHOS, BILITOT, PROT, ALBUMIN  in the last 168 hours. Recent Labs  Lab 05/25/24 0506 05/26/24 0539  LIPASE 126* 115*   No results for input(s): AMMONIA in the last 168 hours. CBC: Recent Labs  Lab 05/25/24 0506 05/26/24 0539 05/27/24 0558  WBC 5.3 4.4 5.0  HGB 16.5* 16.5* 16.0*  HCT 48.2* 47.0* 46.2*  MCV 88.6 88.3 88.3  PLT 278 247 236   Cardiac  Enzymes: No results for input(s): CKTOTAL, CKMB, CKMBINDEX, TROPONINI in the last 168 hours. BNP: Invalid input(s): POCBNP CBG: No results for input(s): GLUCAP in the last 168 hours. D-Dimer No results for input(s): DDIMER in the last 72 hours. Hgb A1c No results for input(s): HGBA1C in the last 72 hours. Lipid Profile No results for input(s): CHOL, HDL, LDLCALC, TRIG, CHOLHDL, LDLDIRECT in the last 72 hours. Thyroid  function studies No results for input(s): TSH, T4TOTAL, T3FREE, THYROIDAB in the last 72 hours.  Invalid input(s): FREET3 Anemia work up No results for input(s): VITAMINB12, FOLATE, FERRITIN, TIBC, IRON, RETICCTPCT in the last 72 hours. Urinalysis    Component Value Date/Time   COLORURINE YELLOW 05/12/2024 1505   APPEARANCEUR CLEAR 05/12/2024 1505   LABSPEC 1.024 05/12/2024 1505   PHURINE 5.0 05/12/2024 1505   GLUCOSEU NEGATIVE 05/12/2024 1505   GLUCOSEU NEGATIVE 09/12/2012 1147   HGBUR LARGE (A) 05/12/2024 1505   BILIRUBINUR SMALL (A) 05/12/2024 1505   KETONESUR 40 (A) 05/12/2024 1505   PROTEINUR TRACE (A) 05/12/2024 1505   UROBILINOGEN 0.2 09/12/2012 1147   NITRITE NEGATIVE 05/12/2024 1505   LEUKOCYTESUR TRACE (A) 05/12/2024 1505   Sepsis Labs Recent Labs  Lab 05/25/24 0506 05/26/24 0539 05/27/24 0558  WBC 5.3 4.4 5.0   Microbiology No results found for this or any previous visit (from the past 240 hours).   Time coordinating discharge: Over 30 minutes  SIGNED:   Darcel Dawley, MD  [facility] 05/31/2024, 3:11 PM Pager   If 7PM-7AM, please contact night-coverage

## 2024-06-03 ENCOUNTER — Encounter (HOSPITAL_COMMUNITY): Payer: Self-pay | Admitting: Gastroenterology

## 2024-06-03 LAB — SURGICAL PATHOLOGY

## 2024-06-13 NOTE — Progress Notes (Signed)
 Name: Shelley [name] Date of visit: 06/16/24  Chief Complaint   Chief Complaint  Patient presents with  . Depression    Subjective  Shelley [name] is a 63 y.o. female who presents today for follow up/discuss health concern.  Patient was last seen by me on 06/05/2024.   History of Present Illness The patient presents for evaluation of depression. She is accompanied by her niece.  She has been experiencing intermittent depression for the past 3.5 years. There is no threat to others, but she reports having suicidal thoughts due to feeling misunderstood. Approximately 3 years ago, she attempted suicide with pills. She describes a constant feeling of being under a shadow and experiencing auditory hallucinations, such as hearing voices. She has previously taken Xanax for her mental health but expresses reluctance to start new medications due to side effects like dizziness. Additionally, she mentions difficulty swallowing large pills, which causes her anxiety.  She has abstained from alcohol and uses melatonin gummies to aid sleep, although she sometimes struggles with insomnia.  Alcohol: She has abstained from alcohol. Sleep: She uses melatonin gummies to aid sleep and sometimes struggles with insomnia.       Current Outpatient Medications  Medication Instructions  . escitalopram (LEXAPRO) 10 mg, oral, Daily  . hydrOXYzine pamoate (VISTARIL) 25 mg, oral, Every 6 hours PRN  . losartan  (COZAAR ) 25 mg, oral, Daily  . OLANZapine  (ZYPREXA ) 5 mg, oral, Daily PRN  . pantoprazole  (PROTONIX ) 20 mg, Daily  . potassium chloride  20 mEq ER tablet 40 mEq  . scopolamine  (TRANSDERM-SCOP) 1 mg over 3 days pt3d patch 1 patch, transdermal, Every 72 hours  . sucralfate  (CARAFATE ) 1 g   ROS  Other systems reviewed and negative. No other complaints.  Objective   Vitals:   06/16/24 1517  BP: 135/76  Pulse: 93  Temp: 97 F (36.1 C)     Physical Exam: Cardio: Perfusing rhythm Resp: No labored  respirations MSK:  No gross abnormality Skin: Warm and dry Neurologic:  No focal deficits Psych: flat affect, alert and oriented, linear thought process    Assessment & Plan   Orders Placed This Encounter  Procedures  . Ambulatory referral to Psychiatry    Orders Placed This Encounter  Medications  . hydrOXYzine pamoate (VISTARIL) 25 mg capsule    Sig: Take 1 capsule (25 mg total) by mouth every 6 (six) hours as needed for anxiety.    Dispense:  30 capsule    Refill:  0  . escitalopram (LEXAPRO) 10 mg tablet    Sig: Take 1 tablet (10 mg total) by mouth daily.    Dispense:  90 tablet    Refill:  3     1. Current moderate episode of major depressive disorder, unspecified whether recurrent (CMD) (Primary) - She reports experiencing depression for the past 3.5 years, with intermittent periods of feeling okay. She has had suicidal thoughts and auditory hallucinations. Currently denies suicidal ideation, intent or plan but does cry several times to overdose on pills.  When it is severe, her auditory hallucinations will tell her to kill herself.  Symptomatology suggestive of schizoaffective disorder - The importance of mental health and the potential benefits of medication were discussed.  -  A referral to a psychiatrist was made for further evaluation and management of her auditory hallucinations and depressive thoughts. - Rx Hydroxyzine 25 mg prn for anxiety, can make you sleepy/drowsy so best taken in the evening-night which may also help with sleep.  - Rx  Lexapro 10 mg daily, can take up to 4-6 weeks for therapeutic effect, adherence encouraged and side effects discussed, pt agreed    2. Primary hypertension  BP Readings from Last 3 Encounters:  06/16/24 135/76  06/05/24 112/81  06/16/22 143/83    Chronic, stable. No acute or worsening symptoms - continue losartan  25 mg daily -cont to keep home Blood pressure Log and record pressures at least once in the AM, goal BP <130/90.            Follow up as scheduled and as needed.  patient verbalized agreement to above plan. A total of at least 30 minutes was spent before the visit reviewing the chart, time spent during the visit, and time spent after the visit on documentation.       The patient was informed that if lab work was obtained today, I will personally review any abnormal results, and if further evaluation or follow-up is needed, they will be notified directly through their patient portal or via phone (if they do not have a patient portal set up). The patient acknowledged and understands this.  This note was dictated with voice recognition software. Transcription error is likely and similar sounding words may be inadvertently transcribed incorrectly.     Richerd Ship, MD

## 2024-06-17 NOTE — Telephone Encounter (Signed)
 Left msg for pt schedule [id number]

## 2024-06-29 NOTE — Progress Notes (Unsigned)
 Cardiology Office Note    Date:  07/01/2024  ID:  Shelley [name], DOB September 09, 1961, MRN [id number] PCP:  Ilah Crigler, MD  Cardiologist:  Maude Emmer, MD  Electrophysiologist:  None   Chief Complaint: Follow up for QT prolongation   History of Present Illness: .   Shelley [name] is a 63 y.o. female with visit-pertinent history of thyroid  disease, OSA on CPAP and hypertension.  Patient first seen by Dr. Nguyen on 05/24/2024 of the request of hospitalist service for abnormal ECG showing bradycardia with heart rate 45 to 55 BPM, long QT and T wave inversions in the inferior lateral leads.  Patient denied any chest pain and troponins were negative.  Echocardiogram indicated LVEF 55% no RWMA, mild LVH of the basal septal segment, diastolic parameters were normal, RV systolic function and size was normal, trivial mitral regurgitation with no evidence of stenosis, aortic valve regurgitation was not visualized, no stenosis was present.  Patient was admitted with diverticulitis seen on CT, started on Rocephin  and metronidazole , lipase elevated at 124.  Patient's potassium was kept at greater than 4 magnesium  greater than 2 with cessation of most QT prolonging agents her QT improved from>600 to Qtc of 480 ms.  Patient was discharged on 05/31/2024 in stable condition.  Today she presents for follow up with her niece and interpreter services. She reports that she is doing well overall.  She was seen by her PCP and started on Lexapro 10 mg daily and hydroxyzine, referred to psychiatry for depression and hallucinations.  She denies any chest pain or significant shortness of breath.  She endorses a hickup like sensation or a skipped beat sensation in her chest that happens around 3 times a day, endorses a slight feeling of dizziness, that sounds nearly constant, denies any sustained palpitations or chest pain or significant shortness of breath.  She does endorse increased dizziness that is present throughout the day,  worse when she turns her head and feels as though the room is spinning, notes this has been an ongoing problem, as well as possible visible and auditory hallucinations.  Patient reports that she has not had any further significant nausea or vomiting that has previously been noted, reports that she is no longer drinking alcohol. ROS: .   Today she denies chest pain, shortness of breath, lower extremity edema, fatigue, melena, hematuria, hemoptysis, diaphoresis, weakness, syncope, orthopnea, and PND.  All other systems are reviewed and otherwise negative. Studies Reviewed: [name]   EKG:  EKG is ordered today, personally reviewed, demonstrating EKG Interpretation Date/Time:  Tuesday July 01 2024 08:25:21 EDT Ventricular Rate:  73 PR Interval:  176 QRS Duration:  76 QT Interval:  390 QTC Calculation: 429 R Axis:   -16  Text Interpretation: Normal sinus rhythm Low voltage QRS Septal infarct (cited on or before 21-May-2024) T wave abnormality, consider lateral ischemia Confirmed by Cobie Marcoux [phone]) on 07/01/2024 1:16:32 PM   CV Studies: Cardiac studies reviewed are outlined and summarized above. Otherwise please see EMR for full report. Cardiac Studies & Procedures   ______________________________________________________________________________________________     ECHOCARDIOGRAM  ECHOCARDIOGRAM COMPLETE 05/24/2024  Narrative ECHOCARDIOGRAM REPORT    Patient Name:   Shelley [name] [facility] Date of Exam: 05/24/2024 Medical Rec #:  [id number]     Height:       66.0 in Accession #:    [id number]    Weight:       183.0 lb Date of Birth:  1961-06-23      BSA:  1.926 m Patient Age:    63 years      BP:           158/82 mmHg Patient Gender: F             HR:           48 bpm. Exam Location:  Inpatient  Procedure: 2D Echo, Cardiac Doppler and Color Doppler (Both Spectral and Color Flow Doppler were utilized during procedure).  Indications:    Abnormal ECG R94.31  History:        Patient has  no prior history of Echocardiogram examinations. Risk Factors:Sleep Apnea.  Sonographer:    Jayson Gaskins Referring Phys: [id number] ELVAN SOR  IMPRESSIONS   1. Left ventricular ejection fraction, by estimation, is 55%. The left ventricle has low normal function. The left ventricle has no regional wall motion abnormalities. There is mild left ventricular hypertrophy of the basal-septal segment. Left ventricular diastolic parameters were normal. 2. Right ventricular systolic function is normal. The right ventricular size is normal. 3. The mitral valve is grossly normal. Trivial mitral valve regurgitation. No evidence of mitral stenosis. 4. The aortic valve is tricuspid. Aortic valve regurgitation is not visualized. No aortic stenosis is present. 5. The inferior vena cava is normal in size with greater than 50% respiratory variability, suggesting right atrial pressure of 3 mmHg.  Conclusion(s)/Recommendation(s): Normal biventricular function without evidence of hemodynamically significant valvular heart disease.  FINDINGS Left Ventricle: Left ventricular ejection fraction, by estimation, is 55%. The left ventricle has low normal function. The left ventricle has no regional wall motion abnormalities. Strain was performed and the global longitudinal strain is indeterminate. The left ventricular internal cavity size was normal in size. There is mild left ventricular hypertrophy of the basal-septal segment. Left ventricular diastolic parameters were normal.  Right Ventricle: The right ventricular size is normal. No increase in right ventricular wall thickness. Right ventricular systolic function is normal.  Left Atrium: Left atrial size was normal in size.  Right Atrium: Right atrial size was normal in size.  Pericardium: There is no evidence of pericardial effusion.  Mitral Valve: The mitral valve is grossly normal. Normal mobility of the mitral valve leaflets. Trivial mitral valve  regurgitation. No evidence of mitral valve stenosis.  Tricuspid Valve: The tricuspid valve is grossly normal. Tricuspid valve regurgitation is trivial. No evidence of tricuspid stenosis.  Aortic Valve: The aortic valve is tricuspid. Aortic valve regurgitation is not visualized. No aortic stenosis is present. Aortic valve mean gradient measures 4.0 mmHg. Aortic valve peak gradient measures 7.0 mmHg. Aortic valve area, by VTI measures 1.69 cm.  Pulmonic Valve: The pulmonic valve was not well visualized. Pulmonic valve regurgitation is not visualized. No evidence of pulmonic stenosis.  Aorta: The aortic root is normal in size and structure.  Venous: The inferior vena cava is normal in size with greater than 50% respiratory variability, suggesting right atrial pressure of 3 mmHg.  IAS/Shunts: No atrial level shunt detected by color flow Doppler.  Additional Comments: 3D was performed not requiring image post processing on an independent workstation and was indeterminate.   LEFT VENTRICLE PLAX 2D LVIDd:         4.50 cm   Diastology LVIDs:         2.80 cm   LV e' medial:    4.24 cm/s LV PW:         1.00 cm   LV E/e' medial:  11.9 LV IVS:  1.20 cm   LV e' lateral:   6.31 cm/s LVOT diam:     2.00 cm   LV E/e' lateral: 8.0 LV SV:         46 LV SV Index:   24 LVOT Area:     3.14 cm   RIGHT VENTRICLE RV S prime:     12.10 cm/s  LEFT ATRIUM             Index        RIGHT ATRIUM           Index LA Vol (A2C):   31.2 ml 16.20 ml/m  RA Area:     14.70 cm LA Vol (A4C):   13.7 ml 7.11 ml/m   RA Volume:   32.80 ml  17.03 ml/m LA Biplane Vol: 21.3 ml 11.06 ml/m AORTIC VALVE AV Area (Vmax):    2.03 cm AV Area (Vmean):   1.83 cm AV Area (VTI):     1.69 cm AV Vmax:           132.00 cm/s AV Vmean:          98.900 cm/s AV VTI:            0.274 m AV Peak Grad:      7.0 mmHg AV Mean Grad:      4.0 mmHg LVOT Vmax:         85.50 cm/s LVOT Vmean:        57.700 cm/s LVOT VTI:           0.147 m LVOT/AV VTI ratio: 0.54  AORTA Ao Root diam: 2.90 cm  MITRAL VALVE MV Area (PHT): 2.72 cm    SHUNTS MV E velocity: 50.60 cm/s  Systemic VTI:  0.15 m MV A velocity: 64.30 cm/s  Systemic Diam: 2.00 cm MV E/A ratio:  0.79  Maude Emmer MD Electronically signed by Maude Emmer MD Signature Date/Time: 05/24/2024/10:57:28 AM    Final          ______________________________________________________________________________________________       Current Reported Medications:.    Current Meds  Medication Sig   escitalopram (LEXAPRO) 10 MG tablet Take 10 mg by mouth daily.   hydrOXYzine (VISTARIL) 25 MG capsule Take 25 mg by mouth every 6 (six) hours as needed for anxiety.   Physical Exam:    VS:  BP 122/70   Pulse 73   Ht 5' 8 (1.727 m)   Wt 197 lb (89.4 kg)   SpO2 97%   BMI 29.95 kg/m    Wt Readings from Last 3 Encounters:  07/01/24 197 lb (89.4 kg)  05/24/24 182 lb 6.4 oz (82.7 kg)  05/12/24 160 lb (72.6 kg)    GEN: Well nourished, well developed in no acute distress NECK: No JVD; No carotid bruits CARDIAC: RRR, no murmurs, rubs, gallops RESPIRATORY:  Clear to auscultation without rales, wheezing or rhonchi  ABDOMEN: Soft, non-tender, non-distended EXTREMITIES:  No edema; No acute deformity     Asessement and Plan:.    QT/QTC prolongation: Patient admitted in 05/2024 with diverticulitis.  EKG indicated long QT and T wave inversions in the inferior lateral leads.  Patient with noted improvement with discontinuation of QT prolonging nausea agents. EKG today indicates normal sinus rhythm at 73 bpm, T wave abnormality in lateral leads, calculated QTc 449 MS.  Patient's QTc is stable, with initiation of Lexapro and hydroxyzine. Stressed with patient need for routine monitoring of EKG and avoiding QT prolonging medications and risk of  Lexapro.  Patient does endorse occasional hickup like sensation or skipped beats sensation in her chest that can occur multiple  times a day.  Given history of QTc prolongation, dizziness and palpitations patient agreeable to 2-week cardiac monitor for further assessment.  Patient to follow-up for genetic testing in November.  Reviewed ED precautions with patient. Check BMET and Mag level today.   Dizziness: Patient family note she has ongoing problems with dizziness, sounds that this was a problem even prior to recent admission, per family it was questioned if this was related to prior alcohol intake.  Patient notes dizziness that almost seems as though it is present throughout the day or nearly constant, if she notes that it is worse when she turns her head or has change in position.  I had patient turn her head quickly in office, she noted significant dizziness as if the room was spinning, overall sounds to be vertigo.  Note possible worsening of dizziness when on losartan  in setting of possibly low blood pressure as blood pressure today is normal.  Encouraged patient to stay hydrated and start monitoring her blood pressure at home. Patient to wear two week Zio as noted above. Reviewed ED precautions.   Hypertension: Blood pressure today 122/70.  Patient reports increased dizziness and lightheadedness, did note some worsening while on losartan  which she believe she has not taken in recent days as she ran out of the medication, was not continued by her PCP.  Patient was not monitoring at home, will not refill the medication at this time as question if her blood pressure was low at home.  Encouraged patient to obtain a blood pressure cuff and to notify the office if consistently elevated above 130/80.    Disposition: F/u with Darletta Noblett, NP in 8 weeks or sooner if needed.   Signed, Emilee Market D Jelisa [place], NP

## 2024-07-01 ENCOUNTER — Ambulatory Visit

## 2024-07-01 ENCOUNTER — Ambulatory Visit: Attending: Cardiology | Admitting: Cardiology

## 2024-07-01 ENCOUNTER — Encounter: Payer: Self-pay | Admitting: Cardiology

## 2024-07-01 ENCOUNTER — Other Ambulatory Visit: Payer: Self-pay | Admitting: Cardiology

## 2024-07-01 VITALS — BP 122/70 | HR 73 | Ht 68.0 in | Wt 197.0 lb

## 2024-07-01 DIAGNOSIS — R42 Dizziness and giddiness: Secondary | ICD-10-CM | POA: Diagnosis not present

## 2024-07-01 DIAGNOSIS — R002 Palpitations: Secondary | ICD-10-CM | POA: Diagnosis not present

## 2024-07-01 DIAGNOSIS — R9431 Abnormal electrocardiogram [ECG] [EKG]: Secondary | ICD-10-CM | POA: Diagnosis not present

## 2024-07-01 DIAGNOSIS — I10 Essential (primary) hypertension: Secondary | ICD-10-CM

## 2024-07-01 NOTE — Patient Instructions (Signed)
 Medication Instructions:  Your physician recommends that you continue on your current medications as directed. Please refer to the Current Medication list given to you today.  *If you need a refill on your cardiac medications before your next appointment, please call your pharmacy*  Lab Work: TODAY:  BMET & MAG  If you have labs (blood work) drawn today and your tests are completely normal, you will receive your results only by: MyChart Message (if you have MyChart) OR A paper copy in the mail If you have any lab test that is abnormal or we need to change your treatment, we will call you to review the results.  Testing/Procedures: Shelley [name]- Long Term Monitor Instructions  Your physician has requested you wear a ZIO patch monitor for 14 days.  This is a single patch monitor. [employer] supplies one patch monitor per enrollment. Additional stickers are not available. Please do not apply patch if you will be having a Nuclear Stress Test,  Echocardiogram, Cardiac CT, MRI, or Chest Xray during the period you would be wearing the  monitor. The patch cannot be worn during these tests. You cannot remove and re-apply the  ZIO XT patch monitor.  Your ZIO patch monitor will be mailed 3 day USPS to your address on file. It may take 3-5 days  to receive your monitor after you have been enrolled.  Once you have received your monitor, please review the enclosed instructions. Your monitor  has already been registered assigning a specific monitor serial # to you.  Billing and Patient Assistance Program Information  We have supplied [employer] with any of your insurance information on file for billing purposes. [employer] offers a sliding scale Patient Assistance Program for patients that do not have  insurance, or whose insurance does not completely cover the cost of the ZIO monitor.  You must apply for the Patient Assistance Program to qualify for this discounted rate.  To apply, please call [employer] at  [phone], select option 4, select option 2, ask to apply for  Patient Assistance Program. [name] will ask your household income, and how many people  are in your household. They will quote your out-of-pocket cost based on that information.  [employer] will also be able to set up a 72-month, interest-free payment plan if needed.  Applying the monitor   Shave hair from upper left chest.  Hold abrader disc by orange tab. Rub abrader in 40 strokes over the upper left chest as  indicated in your monitor instructions.  Clean area with 4 enclosed alcohol pads. Let dry.  Apply patch as indicated in monitor instructions. Patch will be placed under collarbone on left  side of chest with arrow pointing upward.  Rub patch adhesive wings for 2 minutes. Remove white label marked 1. Remove the white  label marked 2. Rub patch adhesive wings for 2 additional minutes.  While looking in a mirror, press and release button in center of patch. A small green light will  flash 3-4 times. This will be your only indicator that the monitor has been turned on.  Do not shower for the first 24 hours. You may shower after the first 24 hours.  Press the button if you feel a symptom. You will hear a small click. Record Date, Time and  Symptom in the Patient Logbook.  When you are ready to remove the patch, follow instructions on the last 2 pages of Patient  Logbook. Stick patch monitor onto the last page of Patient Logbook.  Place  Patient Logbook in the blue and white box. Use locking tab on box and tape box closed  securely. The blue and white box has prepaid postage on it. Please place it in the mailbox as  soon as possible. Your physician should have your test results approximately 7 days after the  monitor has been mailed back to [name].  Call [name] Customer Care at [phone] if you have questions regarding  your ZIO XT patch monitor. Call them immediately if you see an orange light  blinking on your  monitor.  If your monitor falls off in less than 4 days, contact our Monitor department at [phone].  If your monitor becomes loose or falls off after 4 days call [employer] at [phone] for  suggestions on securing your monitor   Follow-Up: At [name], you and your health needs are our priority.  As part of our continuing mission to provide you with exceptional heart care, our providers are all part of one team.  This team includes your primary Cardiologist (physician) and Advanced Practice Providers or APPs (Physician Assistants and Nurse Practitioners) who all work together to provide you with the care you need, when you need it.  Your next appointment:   8 week(s)  Provider:   Maude Emmer, MD or Shelley West, NP          We recommend signing up for the patient portal called MyChart.  Sign up information is provided on this After Visit Summary.  MyChart is used to connect with patients for Virtual Visits (Telemedicine).  Patients are able to view lab/test results, encounter notes, upcoming appointments, etc.  Non-urgent messages can be sent to your provider as well.   To learn more about what you can do with MyChart, go to [email or web address].   Other Instructions

## 2024-07-01 NOTE — Progress Notes (Unsigned)
 Enrolled for [employer] to mail a ZIO XT long term holter monitor to the patients address on file.   Requested delivery date of 07/08/2024.  Dr. Delford to read.

## 2024-07-02 ENCOUNTER — Ambulatory Visit: Payer: Self-pay | Admitting: Cardiology

## 2024-07-02 LAB — BASIC METABOLIC PANEL WITH GFR
BUN/Creatinine Ratio: 13 (ref 12–28)
BUN: 9 mg/dL (ref 8–27)
CO2: 24 mmol/L (ref 20–29)
Calcium: 9.5 mg/dL (ref 8.7–10.3)
Chloride: 102 mmol/L (ref 96–106)
Creatinine, Ser: 0.72 mg/dL (ref 0.57–1.00)
Glucose: 78 mg/dL (ref 70–99)
Potassium: 4.6 mmol/L (ref 3.5–5.2)
Sodium: 139 mmol/L (ref 134–144)
eGFR: 94 mL/min/1.73 (ref >59)

## 2024-07-02 LAB — MAGNESIUM: Magnesium: 2.2 mg/dL (ref 1.6–2.3)

## 2024-07-23 DIAGNOSIS — R002 Palpitations: Secondary | ICD-10-CM | POA: Diagnosis not present

## 2024-07-23 DIAGNOSIS — I10 Essential (primary) hypertension: Secondary | ICD-10-CM

## 2024-07-23 DIAGNOSIS — R9431 Abnormal electrocardiogram [ECG] [EKG]: Secondary | ICD-10-CM | POA: Diagnosis not present

## 2024-08-03 ENCOUNTER — Ambulatory Visit: Payer: Self-pay | Admitting: Cardiology

## 2024-08-08 NOTE — Progress Notes (Signed)
 Called patient and Shelley [name] patient niece answered (pre DNP), informed her about patient monitor results and verbalized understanding. All questions (if any) were answered. Reminded her about patient next f/u appt on 12/01 with Katlyn West.

## 2024-08-19 ENCOUNTER — Ambulatory Visit: Admitting: Medical Genetics

## 2024-08-19 VITALS — BP 131/71 | HR 72 | Wt 197.1 lb

## 2024-08-19 DIAGNOSIS — Z1379 Encounter for other screening for genetic and chromosomal anomalies: Secondary | ICD-10-CM

## 2024-08-19 DIAGNOSIS — H9193 Unspecified hearing loss, bilateral: Secondary | ICD-10-CM

## 2024-08-19 DIAGNOSIS — R9431 Abnormal electrocardiogram [ECG] [EKG]: Secondary | ICD-10-CM | POA: Diagnosis not present

## 2024-08-19 NOTE — Progress Notes (Signed)
 MEDICAL GENETICS NEW PATIENT EVALUATION  Patient name: Shelley [name] DOB: 1961-09-10 Age: 63 y.o. MRN: [id number]  Referring Provider/Specialty: Rollo Felt, PA  Date of Evaluation: 08/19/2024 Chief Complaint/Reason for Referral: Hearing loss, prolonged QT  Assessment: We discussed with Shelley [name] and her family that there could be a genetic cause to her various medical symptoms. Hearing loss and long QT can be associated with Jervell and Lange-Nielsen syndrome. It is also possible that her congenital hearing loss and prolonged QT are not due to a genetic cause, or are not due to the same underlying condition. Appropriate testing at this time would include gene panels for hearing loss and cardiac arhythmia. Shelley [name] declined genetic testing for hearing loss, but was interested in testing for cardiac arrhythmia. Therefore, consent and samples were obtained for a comprehensive arrhythmia panel through [facility]. The results are expected in 1 month, and we will contact her and her family when they are available. Shelley [name] should otherwise continue her current medical care and resource services through school as needed.  Recommendations: Comprehensive arrhythmia gene panel through [facility] - results expected in 1 month. Continue follow up with current medical providers per their recommendations.  Follow up in genetics clinic will be based on the results of the testing.   HPI: Shelley [name] is a 63 y.o. assigned female at birth who presents today for an initial genetics evaluation for long QT syndrome. She is accompanied by her sister and niece, who provided the history. This information, along with a review of pertinent records, labs, and radiology studies, is summarized below.  Shelley [name] has a history of bilateral sensorineural hearing loss that per report started at around 62 months of age when she received a high dose of penicillin. Shelley [name] has other medical concerns as noted below. She  is not interested in genetic testing related to her history of hearing loss.  Shelley [name] had been experiencing several episodes of vomiting of unclear cause in 05/2024. She was seen in the ED for the third time on 05/20/2024 and admitted to the hospital and found to have diverticulitis. During the hospitalization she developed bradycardia and prolonged QT. This resolved over the course of a few days, but inpatient cardiology recommended a genetics evaluation to determine if she could have an inherited long QT syndrome.  Past Medical History: Patient Active Problem List   Diagnosis Date Noted   Diverticulitis of sigmoid colon 05/21/2024   Intractable nausea and vomiting 05/20/2024   Intractable vomiting 05/20/2024   Malnutrition of moderate degree 01/09/2022   Folate deficiency 01/08/2022   Hypoglycemia 01/08/2022   Abnormal ECG 01/07/2022   Deafness 01/07/2022   Hypokalemia 01/07/2022   Diverticulosis of colon 01/07/2022   Family history of malignant neoplasm of digestive organs 01/07/2022   Morbid obesity (HCC) 01/07/2022   Weight loss 01/07/2022   Chronic vomiting 01/07/2022   Alcohol abuse 01/07/2022   Acute pancreatitis 01/06/2022   Sleep apnea 10/24/2012   Screening for cervical cancer 09/12/2012   Need for Tdap vaccination 09/12/2012   Routine general medical examination at a health care facility 09/12/2012   Other screening mammogram 09/12/2012   Thyromegaly 09/12/2012   Medications: Current Outpatient Medications on File Prior to Visit  Medication Sig Dispense Refill   escitalopram (LEXAPRO) 10 MG tablet Take 10 mg by mouth daily.     hydrOXYzine (VISTARIL) 25 MG capsule Take 25 mg by mouth every 6 (six) hours as needed for anxiety.  losartan  (COZAAR ) 25 MG tablet Take 1 tablet (25 mg total) by mouth daily. (Patient not taking: Reported on 07/01/2024) 30 tablet 0   OLANZapine  (ZYPREXA ) 5 MG tablet Take 1 tablet (5 mg total) by mouth daily. (Patient not taking: Reported on  07/01/2024) 30 tablet 0   pantoprazole  (PROTONIX ) 20 MG tablet Take 1 tablet (20 mg total) by mouth daily. (Patient not taking: Reported on 07/01/2024) 30 tablet 2   scopolamine  (TRANSDERM-SCOP) 1 MG/3DAYS Place 1 patch (1 mg total) onto the skin every 3 (three) days. (Patient not taking: Reported on 07/01/2024) 10 patch 0   No current facility-administered medications on file prior to visit.   Allergies:  Allergies  Allergen Reactions   Penicillins Other (See Comments)    Caused deafness per pt  Other Reaction(s): Unknown   Tylenol [Acetaminophen] Other (See Comments)    Causes shaking    Review of Systems: Negative except as noted in the HPI  Family History: The family history was notable for the following: Brother, 67 yo, with a cardiac stent, colon cancer, prostate cancer, and T2DM. His daughter, 62 yo, with heart murmur, multiple strokes, and multiple syncopal episodes. Sister, 72 yo, with lupus, colon cancer at 62 yo, and T2DM. Her son, with seizures beginning at 52 yo. 4 additional brothers with hypertension.   Paternal Family History Father, deceased at 74 yo, from a heart attack. Uncle, deceased from stroke. Uncle, deceased from heart attack.   Maternal Family History Mother, deceased at 73 yo, with a history of stroke. Aunt, with a seizure disorder. Aunt, deceased from a stroke.  Also with diabetes and hypertension. Aunt, deceased from a heart attack. Uncle, deceased, with multiple strokes.   Mother's ethnicity: African American Father's ethnicity: African American Consangunity: Denies Please see the dentist note for additional information  Social History: Lives in [place]  Vitals: Weight: 197.1 cm   Genetics Physical Exam:  Constitution: The patient is active and alert  Head: No abnormalities detected in: head, hairline, shape or size    Anterior fontanelle flat: not flat    Anterior fontanelle open: not open    Bitemporal narrowing:  forehead not narrow    Frontal bossing: no frontal bossing    Macrocephaly: not macrocephalic    Microcephaly: not microcephalic    Plagiocephaly: not plagiocephalic  Face: No abnormalities detected in: face, midface or shape    Coarse facial features: no coarse facies    Midfacial hypoplasia: no midfacial hypoplasia  Ears: No abnormalities detected in: ears    Low-set ears: ears not low set    Posteriorly rotated ears: ears not posteriorly rotated  Cardiac: No abnormalities detected in: cardiovascular system    Abnormal distal perfusion: normal distal perfusion    Irregular rate: heart rate regular    Irregular rhythm: regular rhythm    Murmur: no murmur  Lungs: No abnormalities detected in: pulmonary system, bilateral auscultation or effort  Abdomen: No abnormalities detected in: abdomen or appearance    Abnormal umbilicus: normal umbilicus    Diastasis recti: no diastasis recti    Distended abdomen: no distension    Hepatosplenomegaly: no hepatosplenomegaly    Umbilical hernia: no umbilical hernia  Neurological: No abnormalities detected in: neurological system, deep tendon reflexes, antigravity movement of extremities, strength, facial movement or tone    Hypertonia: not hypertonic    Hypotonia: not hypotonic  Genitourinary: not assessed  Hair, Nails, and Skin: No abnormalities detected in: integumentary system, hair, nails or skin    Abnormally  healed scars: no abnormally healed scars    Birthmarks: no birthmarks    Lesions: no lesions  Extremities: No abnormalities detected in: extremities    Asymmetric girth: symmetric girth    Contractures: no joint contractures    Limited range of motion: non-limited ROM   Kaleah Hagemeister Haldeman-Englert, MD [facility]/Genetics Date: 08/19/2024 Time: 1130   Total time spent: 70 minutes Time spent includes face to face and non-face to face care for the patient on the date of this encounter (history and physical,  genetic counseling, coordination of care, data gathering and/or documentation as outlined).  Genetic counselor: Lum Molt, MS, [facility]

## 2024-08-19 NOTE — Progress Notes (Signed)
 GENETIC COUNSELING NEW PATIENT EVALUATION Patient name: Shelley [name] DOB: 08-13-1961 Age: 63 y.o. MRN: [id number]  Referring Provider/Specialty: Rollo Felt, PA  Date of Evaluation: 08/19/2024 Chief Complaint/Reason for Referral: Hearing loss, prolonged QT   Brief Summary: Shelley [name] is a 63 y.o. female who presents today for an initial genetics evaluation for hearing loss and prolonged QT syndrome. She is accompanied by her sister and niece at today's visit. A sign language interpretor was present for this appointment.  Prior genetic testing has not been performed.  Family History: See pedigree obtained during today's visit under History->Family->Pedigree.  The family history was notable for the following: Brother, 16 yo, with a cardiac stent, colon cancer, prostate cancer, and T2DM. His daughter, 30 yo, with heart murmur, multiple strokes, and multiple syncopal episodes. Sister, 74 yo, with lupus, colon cancer at 62 yo, and T2DM. Her son, with seizures beginning at 11 yo. 4 additional brothers with hypertension.  Paternal Family History Father, deceased at 46 yo, from a heart attack. Uncle, deceased from stroke. Uncle, deceased from heart attack.  Maternal Family History Mother, deceased at 46 yo, with a history of stroke. Aunt, with a seizure disorder. Aunt, deceased from a stroke.  Also with diabetes and hypertension. Aunt, deceased from a heart attack. Uncle, deceased, with multiple strokes.  Mother's ethnicity: African American Father's ethnicity: African American Consangunity: Denies  Prior Genetic testing: None  Genetic Counseling: Shelley [name] is a 63 y.o. female with hearing loss and prolonged QT interval.  For detailed HPI, please see accompanying note from Dr. Chad Haldeman Englert.  Shelley [name] is the only person in her family with hearing loss.  Her hearing loss has previously been attributed to an exposure to penicillin at 57 mos of age; however  it seems more likely that she had hearing loss present at birth.  There are several family members with cancer including her sister, 51 yo, with colon cancer diagnosed at 62 yo and her brother, 68 yo, who has had colon and prostate cancer.  Additionally, she has several maternal and paternal aunts and uncles with myocardial infarction and stroke.  She does have one niece and one great-niece with syncopal episodes.  We discussed that there can be many causes for prolonged QT interval including certain medications, metabolic disorders, and genetic conditions.  Genetic testing is available to determine if Shelley [name] may have a genetic cause for her prolonged QT intervals, in which case cardiology follow-up would be recommended.  One condition, called Jervell Lange-Nielsen syndrome causes both congenital hearing loss and prolonged QT intervals. Appropriate testing would include genetic testing for hereditary arrhythmias. We also discussed additional genetic testing for hearing loss, which was declined at this time.  Shelley [name] was agreeable with genetic testing related to arrhythmias.  Verbal consent was provided after discussion of the types of results, expected cost and timeline, risks, benefits, and limitations.  A buccal sample was collected from Shelley [name] to be sent to [facility] for the Comprehensive Arrhythmias Panel.  Results are expected in 3-4 weeks, at which point we will reach out with more information.  Recommendations: [facility] Comprehensive Arrhythmias Panel Continue follow-up with other healthcare providers as recommended  Date: 08/19/2024 Total time spent: 80 minutes Genetic Counselor-only time: 35 minutes  Genetic Counseling student present: Duwaine Aspen  Time spent includes face to face and non-face to face care for the patient on the date of this encounter (history, genetic counseling, coordination of care, data gathering  and/or documentation as outlined).   Lum Molt  MS [facility] Certified Genetic Counselor [facility] [employer]

## 2024-08-25 NOTE — Progress Notes (Signed)
 Cardiology Office Note    Date:  09/01/2024  ID:  Shelley [name], DOB 04/22/1961, MRN [id number] PCP:  Ilah Crigler, MD  Cardiologist:  Maude Emmer, MD  Electrophysiologist:  None   Chief Complaint: Dizziness  History of Present Illness: .   Shelley [name] is a 63 y.o. female with visit-pertinent history of thyroid  disease, OSA on CPAP and hypertension.   Patient first seen by Dr. Nguyen on 05/24/2024 of the request of hospitalist service for abnormal ECG showing bradycardia with heart rate 45 to 55 BPM, long QT and T wave inversions in the inferior lateral leads.  Patient denied any chest pain and troponins were negative.  Echocardiogram indicated LVEF 55% no RWMA, mild LVH of the basal septal segment, diastolic parameters were normal, RV systolic function and size was normal, trivial mitral regurgitation with no evidence of stenosis, aortic valve regurgitation was not visualized, no stenosis was present.  Patient was admitted with diverticulitis seen on CT, started on Rocephin  and metronidazole , lipase elevated at 124.  Patient's potassium was kept at greater than 4 magnesium  greater than 2 with cessation of most QT prolonging agents her QT improved from>600 to Qtc of 480 ms.  Patient was discharged on 05/31/2024 in stable condition.   Patient was last seen in clinic on 07/01/2024.  She reports she been doing well.  Patient had been seen by her PCP and started on Lexapro 10 mg daily and hydroxyzine, was referred to psychiatry for depression and hallucinations.  Patient denied any chest pain or shortness of breath.  She endorsed a hickup like sensation or a skipped beat sensation the center of her chest that would happen around 3 times a day, endorsed a slight feeling of dizziness that sounded nearly constant.  She denied any sustained palpitations or chest pain.  She endorsed increased dizziness that was present throughout the day, worse when she turns her head and felt as though the room was  spinning, reported that have been an ongoing problem.  She reports she was no longer drinking alcohol.  Cardiac monitor indicated average heart rate of 65 bpm ranging from 44 to 135 bpm.  She had 3 episodes of supraventricular tachycardia, run with a fast interval lasting 6 beats with a max rate of 135 bpm, longest lasting 8 beats with an average rate of 101 bpm.  Today she presents for follow-up with her sister Shelley [name] and interpreter services, Shelley [name].  She reports that she continues to have problems with dizziness, anxiety, chest tightness and also reports auditory hallucinations.  She has been following with her primary care and psychiatrist regarding ongoing hallucinations.  Patient notes problems with increased anxiety such as when driving or being out in public for prolonged periods of times.  She notes that with increased anxiety she will have increased episodes of chest tightness and become increasingly overwhelmed, chest tightness resolves with relaxing and deep breathing.  She is unable to describe if she is having chest tightness on exertion, she provides varying answers to questions.  She also reports problems with her balance related to her dizziness, notes that she does not regularly exercise as a result of this.  On discussion with patient's sister she notes that dizziness has been an ongoing problem since 2022 and was previously associated with increased alcohol intake however patient reports that she discontinued alcohol intake a few months prior with no change in symptoms. ROS: .   Today she denies lower extremity edema, palpitations, melena, hematuria, hemoptysis, diaphoresis,  presyncope, syncope, orthopnea, and PND.  All other systems are reviewed and otherwise negative. Studies Reviewed: [name]   EKG:  EKG is ordered today, personally reviewed, demonstrating  EKG Interpretation Date/Time:  Monday September 01 2024 11:26:25 EST Ventricular Rate:  49 PR Interval:  196 QRS  Duration:  82 QT Interval:  440 QTC Calculation: 397 R Axis:   -17  Text Interpretation: Sinus bradycardia Nonspecific T wave abnormality When compared with ECG of 01-Jul-2024 08:25, Vent. rate has decreased BY  24 BPM Confirmed by Yona Kosek [phone]) on 09/01/2024 12:31:29 PM   CV Studies: Cardiac studies reviewed are outlined and summarized above. Otherwise please see EMR for full report. Cardiac Studies & Procedures   ______________________________________________________________________________________________     ECHOCARDIOGRAM  ECHOCARDIOGRAM COMPLETE 05/24/2024  Narrative ECHOCARDIOGRAM REPORT    Patient Name:   Shelley [name] [facility] Date of Exam: 05/24/2024 Medical Rec #:  [id number]     Height:       66.0 in Accession #:    [id number]    Weight:       183.0 lb Date of Birth:  12-05-1960      BSA:          1.926 m Patient Age:    63 years      BP:           158/82 mmHg Patient Gender: F             HR:           48 bpm. Exam Location:  Inpatient  Procedure: 2D Echo, Cardiac Doppler and Color Doppler (Both Spectral and Color Flow Doppler were utilized during procedure).  Indications:    Abnormal ECG R94.31  History:        Patient has no prior history of Echocardiogram examinations. Risk Factors:Sleep Apnea.  Sonographer:    Jayson Gaskins Referring Phys: [id number] ELVAN SOR  IMPRESSIONS   1. Left ventricular ejection fraction, by estimation, is 55%. The left ventricle has low normal function. The left ventricle has no regional wall motion abnormalities. There is mild left ventricular hypertrophy of the basal-septal segment. Left ventricular diastolic parameters were normal. 2. Right ventricular systolic function is normal. The right ventricular size is normal. 3. The mitral valve is grossly normal. Trivial mitral valve regurgitation. No evidence of mitral stenosis. 4. The aortic valve is tricuspid. Aortic valve regurgitation is not visualized. No aortic stenosis is  present. 5. The inferior vena cava is normal in size with greater than 50% respiratory variability, suggesting right atrial pressure of 3 mmHg.  Conclusion(s)/Recommendation(s): Normal biventricular function without evidence of hemodynamically significant valvular heart disease.  FINDINGS Left Ventricle: Left ventricular ejection fraction, by estimation, is 55%. The left ventricle has low normal function. The left ventricle has no regional wall motion abnormalities. Strain was performed and the global longitudinal strain is indeterminate. The left ventricular internal cavity size was normal in size. There is mild left ventricular hypertrophy of the basal-septal segment. Left ventricular diastolic parameters were normal.  Right Ventricle: The right ventricular size is normal. No increase in right ventricular wall thickness. Right ventricular systolic function is normal.  Left Atrium: Left atrial size was normal in size.  Right Atrium: Right atrial size was normal in size.  Pericardium: There is no evidence of pericardial effusion.  Mitral Valve: The mitral valve is grossly normal. Normal mobility of the mitral valve leaflets. Trivial mitral valve regurgitation. No evidence of mitral valve stenosis.  Tricuspid Valve: The tricuspid valve is grossly  normal. Tricuspid valve regurgitation is trivial. No evidence of tricuspid stenosis.  Aortic Valve: The aortic valve is tricuspid. Aortic valve regurgitation is not visualized. No aortic stenosis is present. Aortic valve mean gradient measures 4.0 mmHg. Aortic valve peak gradient measures 7.0 mmHg. Aortic valve area, by VTI measures 1.69 cm.  Pulmonic Valve: The pulmonic valve was not well visualized. Pulmonic valve regurgitation is not visualized. No evidence of pulmonic stenosis.  Aorta: The aortic root is normal in size and structure.  Venous: The inferior vena cava is normal in size with greater than 50% respiratory variability, suggesting  right atrial pressure of 3 mmHg.  IAS/Shunts: No atrial level shunt detected by color flow Doppler.  Additional Comments: 3D was performed not requiring image post processing on an independent workstation and was indeterminate.   LEFT VENTRICLE PLAX 2D LVIDd:         4.50 cm   Diastology LVIDs:         2.80 cm   LV e' medial:    4.24 cm/s LV PW:         1.00 cm   LV E/e' medial:  11.9 LV IVS:        1.20 cm   LV e' lateral:   6.31 cm/s LVOT diam:     2.00 cm   LV E/e' lateral: 8.0 LV SV:         46 LV SV Index:   24 LVOT Area:     3.14 cm   RIGHT VENTRICLE RV S prime:     12.10 cm/s  LEFT ATRIUM             Index        RIGHT ATRIUM           Index LA Vol (A2C):   31.2 ml 16.20 ml/m  RA Area:     14.70 cm LA Vol (A4C):   13.7 ml 7.11 ml/m   RA Volume:   32.80 ml  17.03 ml/m LA Biplane Vol: 21.3 ml 11.06 ml/m AORTIC VALVE AV Area (Vmax):    2.03 cm AV Area (Vmean):   1.83 cm AV Area (VTI):     1.69 cm AV Vmax:           132.00 cm/s AV Vmean:          98.900 cm/s AV VTI:            0.274 m AV Peak Grad:      7.0 mmHg AV Mean Grad:      4.0 mmHg LVOT Vmax:         85.50 cm/s LVOT Vmean:        57.700 cm/s LVOT VTI:          0.147 m LVOT/AV VTI ratio: 0.54  AORTA Ao Root diam: 2.90 cm  MITRAL VALVE MV Area (PHT): 2.72 cm    SHUNTS MV E velocity: 50.60 cm/s  Systemic VTI:  0.15 m MV A velocity: 64.30 cm/s  Systemic Diam: 2.00 cm MV E/A ratio:  0.79  Maude Emmer MD Electronically signed by Maude Emmer MD Signature Date/Time: 05/24/2024/10:57:28 AM    Final    MONITORS  LONG TERM MONITOR (3-14 DAYS) 07/22/2024  Narrative Patch Wear Time:  13 days and 20 hours (2025-10-03T17:21:39-0400 to 2025-10-17T14:03:30-0400)  Patient had a min HR of 44 bpm, max HR of 135 bpm, and avg HR of 65 bpm. Predominant underlying rhythm was Sinus Rhythm. 3 Supraventricular Tachycardia runs occurred, the run with  the fastest interval lasting 6 beats with a max rate of  135 bpm, the longest lasting 8 beats with an avg rate of 101 bpm. Isolated SVEs were rare (<1.0%), SVE Couplets were rare (<1.0%), and SVE Triplets were rare (<1.0%). Isolated VEs were rare (<1.0%), and no VE Couplets or VE Triplets were present.  Maude Emmer MD [facility]       ______________________________________________________________________________________________       Current Reported Medications:.    Current Meds  Medication Sig   escitalopram (LEXAPRO) 10 MG tablet Take 10 mg by mouth daily.   pantoprazole  (PROTONIX ) 20 MG tablet Take 1 tablet (20 mg total) by mouth daily.    Physical Exam:    VS:  BP 136/84   Pulse (!) 59   Ht 5' 8 (1.727 m)   Wt 201 lb 12.8 oz (91.5 kg)   SpO2 99%   BMI 30.68 kg/m    Wt Readings from Last 3 Encounters:  09/01/24 201 lb 12.8 oz (91.5 kg)  08/19/24 197 lb 1.6 oz (89.4 kg)  07/01/24 197 lb (89.4 kg)    GEN: Well nourished, well developed in no acute distress NECK: No JVD; No carotid bruits CARDIAC: RRR, no murmurs, rubs, gallops RESPIRATORY:  Clear to auscultation without rales, wheezing or rhonchi  ABDOMEN: Soft, non-tender, non-distended EXTREMITIES:  No edema; No acute deformity     Asessement and Plan:.    Precordial pain: Patient reports intermittent chest tightness that seems to primarily be associated with episodes of increased anxiety or feeling overwhelmed.  Patient notes that she does not significantly exert herself as a result of balance problems related to dizziness.  Patient with history of abnormal EKG with prolonged QT and T wave inversions in inferior lateral leads, wall depth of T wave inversion has improved T waves are still inverted.  EKG today shows improvement in QT prolongation.  Given ongoing chest tightness patient agreeable to coronary CTA.  Discussed need for nitroglycerin during testing, patient is in agreement with testing.  Reviewed ED precautions. Check BMET today.   QT/QTC prolongation: Patient  admitted in 05/2024 with diverticulitis. EKG indicated long QT and T wave inversions in the inferior lateral leads. Patient with noted improvement with discontinuation of QT prolonging nausea agents. Cardiac monitor indicated average heart rate of 65 bpm ranging from 44 to 135 bpm.  She had 3 episodes of supraventricular tachycardia, run with a fast interval lasting 6 beats with a max rate of 135 bpm, longest lasting 8 beats with an average rate of 101 bpm. Genetic testing currently pending.  Today she denies any increased palpitations, notes chest tightness as noted above.  Patient is now being managed by a psychiatrist, will need close monitoring if further medication changes. Reviewed ED precautions. Check BMET and magnesium  level today.   Dizziness: Patient with history of significant dizziness, has previously been questioned if related to prior alcohol intake.  At last office visit noted to have presentation similar to vertigo. Today she continues to report intermittent dizziness, notes this worsens when turning her head and also worsens with increased anxiety, not associated with position changes.  Cardiac monitor and echocardiogram reassuring, will check carotid Dopplers.  She denies any presyncope or syncope.  Reviewed ED precautions.  Hypertension: Blood pressure today 136/84.  Patient notes that her PCP discontinued losartan  in settings of hypotension at home.  Encouraged patient to continue monitoring.   Disposition: F/u with Dr. Nishan in 2-3 months pending testing.   Signed, Karson Reede D Shynia Daleo,  NP

## 2024-09-01 ENCOUNTER — Ambulatory Visit: Attending: Cardiology | Admitting: Cardiology

## 2024-09-01 ENCOUNTER — Encounter: Payer: Self-pay | Admitting: Cardiology

## 2024-09-01 VITALS — BP 136/84 | HR 59 | Ht 68.0 in | Wt 201.8 lb

## 2024-09-01 DIAGNOSIS — I10 Essential (primary) hypertension: Secondary | ICD-10-CM

## 2024-09-01 DIAGNOSIS — R9431 Abnormal electrocardiogram [ECG] [EKG]: Secondary | ICD-10-CM | POA: Diagnosis not present

## 2024-09-01 DIAGNOSIS — R002 Palpitations: Secondary | ICD-10-CM

## 2024-09-01 DIAGNOSIS — R42 Dizziness and giddiness: Secondary | ICD-10-CM

## 2024-09-01 DIAGNOSIS — R072 Precordial pain: Secondary | ICD-10-CM

## 2024-09-01 NOTE — Patient Instructions (Signed)
 Medication Instructions:  Your physician recommends that you continue on your current medications as directed. Please refer to the Current Medication list given to you today.  *If you need a refill on your cardiac medications before your next appointment, please call your pharmacy*  Lab Work: Today: Magnesium , BMP If you have labs (blood work) drawn today and your tests are completely normal, you will receive your results only by: MyChart Message (if you have MyChart) OR A paper copy in the mail If you have any lab test that is abnormal or we need to change your treatment, we will call you to review the results.  Testing/Procedures: Carotid Ultrasound Your physician has requested that you have a carotid duplex. This test is an ultrasound of the carotid arteries in your neck. It looks at blood flow through these arteries that supply the brain with blood. Allow one hour for this exam. There are no restrictions or special instructions.   Coronary CTA   Your cardiac CT will be scheduled at one of the below location:    [facility] [address]  [place], [place] [address]  If scheduled at the [facility] at [address], please enter the parking lot using the [address] entrance and use the FREE valet service at the patient drop-off area. Enter the building and check-in with registration on the main floor.  Please follow these instructions carefully (unless otherwise directed):  An IV will be required for this test and Nitroglycerin will be given.  Hold all erectile dysfunction medications at least 3 days (72 hrs) prior to test. (Ie viagra, cialis, sildenafil, tadalafil, etc)   On the Night Before the Test: Be sure to Drink plenty of water. Do not consume any caffeinated/decaffeinated beverages or chocolate 12 hours prior to your test. Do not take any antihistamines 12 hours prior to your test.  If the patient has contrast  allergy: Patient will need a prescription for Prednisone and very clear instructions (as follows): Prednisone 50 mg - take 13 hours prior to test Take another Prednisone 50 mg 7 hours prior to test Take another Prednisone 50 mg 1 hour prior to test Take Benadryl  50 mg 1 hour prior to test Patient must complete all four doses of above prophylactic medications. Patient will need a ride after test due to Benadryl .  On the Day of the Test: Drink plenty of water until 1 hour prior to the test. Do not eat any food 1 hour prior to test. You may take your regular medications prior to the test.  Take metoprolol (Lopressor) two hours prior to test. If you take Furosemide/Hydrochlorothiazide/Spironolactone/Chlorthalidone, please HOLD on the morning of the test. Patients who wear a continuous glucose monitor MUST remove the device prior to scanning. FEMALES- please wear underwire-free bra if available, avoid dresses & tight clothing       After the Test: Drink plenty of water. After receiving IV contrast, you may experience a mild flushed feeling. This is normal. On occasion, you may experience a mild rash up to 24 hours after the test. This is not dangerous. If this occurs, you can take Benadryl  25 mg, Zyrtec, Claritin, or Allegra and increase your fluid intake. (Patients taking Tikosyn should avoid Benadryl , and may take Zyrtec, Claritin, or Allegra) If you experience trouble breathing, this can be serious. If it is severe call 911 IMMEDIATELY. If it is mild, please call our office.  We will call to schedule your test 2-4 weeks  out understanding that some insurance companies will need an authorization prior to the service being performed.   For more information and frequently asked questions, please visit our website : [email or web address]  For non-scheduling related questions, please contact the cardiac imaging nurse navigator should you have any questions/concerns: Cardiac Imaging  Nurse Navigators Direct Office Dial: [phone]   For scheduling needs, including cancellations and rescheduling, please call Brittany, [phone].  Follow-Up: At [facility], you and your health needs are our priority.  As part of our continuing mission to provide you with exceptional heart care, our providers are all part of one team.  This team includes your primary Cardiologist (physician) and Advanced Practice Providers or APPs (Physician Assistants and Nurse Practitioners) who all work together to provide you with the care you need, when you need it.  Your next appointment:   2 - 3  month(s)  Provider:   Maude Emmer, MD    We recommend signing up for the patient portal called MyChart.  Sign up information is provided on this After Visit Summary.  MyChart is used to connect with patients for Virtual Visits (Telemedicine).  Patients are able to view lab/test results, encounter notes, upcoming appointments, etc.  Non-urgent messages can be sent to your provider as well.   To learn more about what you can do with MyChart, go to [email or web address].   Other Instructions None

## 2024-09-02 ENCOUNTER — Encounter (HOSPITAL_COMMUNITY): Payer: Self-pay | Admitting: *Deleted

## 2024-09-02 ENCOUNTER — Ambulatory Visit: Payer: Self-pay | Admitting: Cardiology

## 2024-09-02 LAB — BASIC METABOLIC PANEL WITH GFR
BUN/Creatinine Ratio: 9 — ABNORMAL LOW (ref 12–28)
BUN: 7 mg/dL — ABNORMAL LOW (ref 8–27)
CO2: 22 mmol/L (ref 20–29)
Calcium: 9.4 mg/dL (ref 8.7–10.3)
Chloride: 99 mmol/L (ref 96–106)
Creatinine, Ser: 0.77 mg/dL (ref 0.57–1.00)
Glucose: 84 mg/dL (ref 70–99)
Potassium: 4.6 mmol/L (ref 3.5–5.2)
Sodium: 137 mmol/L (ref 134–144)
eGFR: 87 mL/min/1.73 (ref >59)

## 2024-09-02 LAB — MAGNESIUM: Magnesium: 2 mg/dL (ref 1.6–2.3)

## 2024-09-02 NOTE — Telephone Encounter (Signed)
-----   Message from Rosabel JONETTA Mose sent at 09/02/2024  8:37 AM EST ----- Please let Ms. Rhudy know that her kidney function is normal and her electrolytes are normal. Good results!  ----- Message ----- From: Interface, [facility] Lab Results In Sent: 09/02/2024   3:36 AM EST To: Katlyn D West, NP

## 2024-09-02 NOTE — Telephone Encounter (Signed)
 Left a detailed message.

## 2024-09-12 ENCOUNTER — Ambulatory Visit (HOSPITAL_COMMUNITY): Admission: RE | Admit: 2024-09-12 | Discharge: 2024-09-12 | Attending: Cardiology

## 2024-09-12 DIAGNOSIS — R42 Dizziness and giddiness: Secondary | ICD-10-CM

## 2024-09-17 NOTE — Progress Notes (Signed)
 Called niece, left message for call back regarding pt results.

## 2024-09-18 ENCOUNTER — Encounter (HOSPITAL_COMMUNITY): Payer: Self-pay

## 2024-09-22 ENCOUNTER — Ambulatory Visit (HOSPITAL_COMMUNITY)
Admission: RE | Admit: 2024-09-22 | Discharge: 2024-09-22 | Disposition: A | Discharge status: Home / Self Care | Source: Ambulatory Visit | Attending: Cardiology | Admitting: Cardiology

## 2024-09-22 DIAGNOSIS — R072 Precordial pain: Secondary | ICD-10-CM | POA: Insufficient documentation

## 2024-09-22 MED ORDER — NITROGLYCERIN 0.4 MG SL SUBL
0.8000 mg | SUBLINGUAL_TABLET | Freq: Once | SUBLINGUAL | Status: AC
  Administered 2024-09-22: 0.8 mg via SUBLINGUAL

## 2024-09-22 MED ORDER — IOHEXOL 350 MG/ML SOLN
100.0000 mL | Freq: Once | INTRAVENOUS | Status: AC | PRN
  Administered 2024-09-22: 100 mL via INTRAVENOUS

## 2024-09-23 ENCOUNTER — Ambulatory Visit (HOSPITAL_COMMUNITY)
Admission: RE | Admit: 2024-09-23 | Discharge: 2024-09-23 | Disposition: A | Discharge status: Home / Self Care | Source: Ambulatory Visit | Attending: Cardiovascular Disease | Admitting: Cardiovascular Disease

## 2024-09-23 ENCOUNTER — Other Ambulatory Visit: Payer: Self-pay | Admitting: Cardiovascular Disease

## 2024-09-23 DIAGNOSIS — R931 Abnormal findings on diagnostic imaging of heart and coronary circulation: Secondary | ICD-10-CM | POA: Insufficient documentation

## 2024-09-23 NOTE — Progress Notes (Signed)
 CT FFR ordered.  Signed, Darryle DASEN. Barbaraann, MD, [facility]  [facility]  [address] [place], [place] [address] [phone]  8:47 AM

## 2024-10-08 ENCOUNTER — Telehealth: Payer: Self-pay | Admitting: Genetic Counselor

## 2024-10-08 ENCOUNTER — Encounter: Payer: Self-pay | Admitting: Genetic Counselor

## 2024-10-08 NOTE — Telephone Encounter (Signed)
 Spoke with Ms. Marchio's sister, [name] Baron, per Ms. Pettinato's request.  Ms. Rudder genetic testing sample did not meet quality assurance thresholds and was unable to be sued for testing.  A new order for an at-home buccal swab kit has been placed for Ms. Kook to complete and mail back to [facility] for genetic testing.  Ms. Baron reported that she would inform Ms. Hubbs of this update. I will also send a MyChart message to Ms. Daffin detailing this update.  I will be in touch when the results of genetic testing are back.  Kimberly Molt, MS [facility] Certified Genetic Counselor

## 2024-10-08 NOTE — Addendum Note (Signed)
 Addended byBETHA MOLT, KIMBERLY SAILOR on: 10/08/2024 10:03 AM   Modules accepted: Orders

## 2024-11-05 ENCOUNTER — Ambulatory Visit: Admitting: Cardiovascular Disease
